# Patient Record
Sex: Male | Born: 1940 | Race: White | Hispanic: No | Marital: Married | Smoking: Former smoker
Health system: Southern US, Community
[De-identification: ages and names within clinical notes are randomized; demographics above are authoritative.]

## PROBLEM LIST (undated history)

## (undated) DIAGNOSIS — I429 Cardiomyopathy, unspecified: Secondary | ICD-10-CM

## (undated) DIAGNOSIS — N183 Chronic kidney disease, stage 3 unspecified: Secondary | ICD-10-CM

## (undated) DIAGNOSIS — E669 Obesity, unspecified: Secondary | ICD-10-CM

## (undated) DIAGNOSIS — E785 Hyperlipidemia, unspecified: Secondary | ICD-10-CM

## (undated) DIAGNOSIS — C44309 Unspecified malignant neoplasm of skin of other parts of face: Secondary | ICD-10-CM

## (undated) DIAGNOSIS — M1990 Unspecified osteoarthritis, unspecified site: Secondary | ICD-10-CM

## (undated) DIAGNOSIS — I10 Essential (primary) hypertension: Secondary | ICD-10-CM

## (undated) DIAGNOSIS — I2510 Atherosclerotic heart disease of native coronary artery without angina pectoris: Secondary | ICD-10-CM

## (undated) DIAGNOSIS — I219 Acute myocardial infarction, unspecified: Secondary | ICD-10-CM

## (undated) DIAGNOSIS — K219 Gastro-esophageal reflux disease without esophagitis: Secondary | ICD-10-CM

## (undated) HISTORY — DX: Hyperlipidemia, unspecified: E78.5

## (undated) HISTORY — DX: Chronic kidney disease, stage 3 (moderate): N18.3

## (undated) HISTORY — PX: CORONARY ANGIOPLASTY WITH STENT PLACEMENT: SHX49

## (undated) HISTORY — DX: Cardiomyopathy, unspecified: I42.9

## (undated) HISTORY — DX: Atherosclerotic heart disease of native coronary artery without angina pectoris: I25.10

## (undated) HISTORY — PX: INGUINAL HERNIA REPAIR: SUR1180

## (undated) HISTORY — DX: Unspecified osteoarthritis, unspecified site: M19.90

## (undated) HISTORY — PX: TONSILLECTOMY: SUR1361

## (undated) HISTORY — DX: Obesity, unspecified: E66.9

## (undated) HISTORY — DX: Chronic kidney disease, stage 3 unspecified: N18.30

## (undated) HISTORY — PX: SKIN CANCER EXCISION: SHX779

## (undated) HISTORY — PX: CARDIAC CATHETERIZATION: SHX172

---

## 2000-09-09 ENCOUNTER — Encounter: Admission: RE | Admit: 2000-09-09 | Discharge: 2000-09-09 | Payer: Self-pay | Admitting: Family Medicine

## 2000-09-09 ENCOUNTER — Encounter: Payer: Self-pay | Admitting: Family Medicine

## 2005-12-07 ENCOUNTER — Ambulatory Visit: Payer: Self-pay | Admitting: Internal Medicine

## 2005-12-21 ENCOUNTER — Ambulatory Visit: Payer: Self-pay | Admitting: Internal Medicine

## 2007-11-22 DIAGNOSIS — I219 Acute myocardial infarction, unspecified: Secondary | ICD-10-CM

## 2007-11-22 HISTORY — DX: Acute myocardial infarction, unspecified: I21.9

## 2008-01-08 ENCOUNTER — Inpatient Hospital Stay (HOSPITAL_BASED_OUTPATIENT_CLINIC_OR_DEPARTMENT_OTHER): Admission: RE | Admit: 2008-01-08 | Discharge: 2008-01-08 | Payer: Self-pay | Admitting: Cardiology

## 2008-01-14 ENCOUNTER — Inpatient Hospital Stay (HOSPITAL_COMMUNITY): Admission: RE | Admit: 2008-01-14 | Discharge: 2008-01-15 | Payer: Self-pay | Admitting: Interventional Cardiology

## 2008-01-16 ENCOUNTER — Ambulatory Visit: Payer: Self-pay | Admitting: Cardiology

## 2008-01-31 ENCOUNTER — Encounter (HOSPITAL_COMMUNITY): Admission: RE | Admit: 2008-01-31 | Discharge: 2008-04-30 | Payer: Self-pay | Admitting: Cardiology

## 2008-06-12 ENCOUNTER — Telehealth: Payer: Self-pay | Admitting: Internal Medicine

## 2008-07-15 DIAGNOSIS — Z860100 Personal history of colon polyps, unspecified: Secondary | ICD-10-CM | POA: Insufficient documentation

## 2008-07-15 DIAGNOSIS — R1084 Generalized abdominal pain: Secondary | ICD-10-CM | POA: Insufficient documentation

## 2009-02-27 ENCOUNTER — Ambulatory Visit (HOSPITAL_COMMUNITY): Admission: RE | Admit: 2009-02-27 | Discharge: 2009-02-27 | Payer: Self-pay | Admitting: General Surgery

## 2011-03-02 LAB — DIFFERENTIAL
Basophils Absolute: 0 10*3/uL (ref 0.0–0.1)
Basophils Relative: 0 % (ref 0–1)
Eosinophils Absolute: 0.4 10*3/uL (ref 0.0–0.7)
Eosinophils Relative: 6 % — ABNORMAL HIGH (ref 0–5)
Lymphocytes Relative: 28 % (ref 12–46)
Lymphs Abs: 2 10*3/uL (ref 0.7–4.0)
Monocytes Absolute: 1 10*3/uL (ref 0.1–1.0)
Monocytes Relative: 14 % — ABNORMAL HIGH (ref 3–12)
Neutro Abs: 3.5 10*3/uL (ref 1.7–7.7)
Neutrophils Relative %: 51 % (ref 43–77)

## 2011-03-02 LAB — BASIC METABOLIC PANEL
BUN: 16 mg/dL (ref 6–23)
CO2: 26 mEq/L (ref 19–32)
Calcium: 9.7 mg/dL (ref 8.4–10.5)
Chloride: 106 mEq/L (ref 96–112)
Creatinine, Ser: 0.88 mg/dL (ref 0.4–1.5)
GFR calc Af Amer: >60 mL/min (ref >60)
GFR calc non Af Amer: >60 mL/min (ref >60)
Glucose, Bld: 118 mg/dL — ABNORMAL HIGH (ref 70–99)
Potassium: 4.3 mEq/L (ref 3.5–5.1)
Sodium: 139 mEq/L (ref 135–145)

## 2011-03-02 LAB — CBC
HCT: 41.8 % (ref 39.0–52.0)
Hemoglobin: 14.1 g/dL (ref 13.0–17.0)
MCHC: 33.8 g/dL (ref 30.0–36.0)
MCV: 88.2 fL (ref 78.0–100.0)
Platelets: 247 10*3/uL (ref 150–400)
RBC: 4.74 MIL/uL (ref 4.22–5.81)
RDW: 14.1 % (ref 11.5–15.5)
WBC: 6.9 10*3/uL (ref 4.0–10.5)

## 2011-04-05 NOTE — Op Note (Signed)
NAME:  Thomas Farrell, Thomas Farrell NO.:  [id number]   MEDICAL RECORD NO.:  [id number]          PATIENT TYPE:  AMB   LOCATION:  SDS                          FACILITY:  [facility]   PHYSICIAN:  Lennie Muckle, MD      DATE OF BIRTH:  Aug 12, 1941   DATE OF PROCEDURE:  02/27/2009  DATE OF DISCHARGE:  02/27/2009                               OPERATIVE REPORT   PREOPERATIVE DIAGNOSES:  Right inguinal hernia, reducible.   POSTOPERATIVE DIAGNOSES:  Right inguinal hernia, reducible.   PROCEDURE:  Laparoscopic right inguinal hernia repair.   SURGEON:  Lennie Muckle, MD   ASSISTANT:  None.   ANESTHESIA:  General endotracheal anesthesia.   FINDINGS:  Direct hernia defect.  No immediate complications.  No drains  were placed.   INDICATIONS FOR PROCEDURE:  Thomas Farrell is a pleasant 70 year old male  who had had hernia for greater than 10 years.  He began having pain  approximately a year and a half ago.  He had seen me for repair of his  hernia.  We talked about open and laparoscopic repair.  Informed consent  was obtained prior to the procedure.  He was cleared by Cardiology.   DETAILS OF PROCEDURE:  Thomas Farrell was identified in the preoperative  holding area, received 2 g of Kefzol and was taken to the operating  room.  Once in the operating room, placed in a supine position.  He had  voided prior to going to the operating room, so after administration of  general endotracheal anesthesia, his abdomen was prepped and draped in  the usual steroid fashion.  A timeout procedure indicating  the patient  and procedure was performed.  I placed an incision just beneath the  umbilicus and identified the anterior rectus fascia.  Incised this with  #11 blade, finger dissected in the preperitoneal space.  I then placed a  balloon Spacemaker Plus into the preperitoneal space and insufflated.  Monitor this under the visualization of the camera.  I waited a minute.  Then I retracted it  perineally and redeployed the balloon.  I waited  approximately another minute, removed the Spacemaker Plus balloon and  insufflated the small balloon at the port.  I then insufflated the  preperitoneal space.  Two 5 mm trocars were placed in the midline under  the visualization with the camera.  I began dissecting along the right  lateral sidewall.  I continued dissecting medially down to the internal  ring.  There appeared to be a large amount of peritoneum medially.  I  dissected and found the internal ring.  I then proceeded medially and  found the direct hernia defect, which I was able to successfully reduce.  I did make a tear in the peritoneum and closed this with a PDS loop.  Two PDS Endoloops were used for closure of the peritoneum.  I continued  dissecting, but began losing some of the preperitoneal space with some  of the CO2 going into the abdomen.  I then placed the Angiocath in  abdomen to evacuate the CO2 to make easy placement of the  mesh.  A 3/6  piece of UltraPro mesh was placed in the preperitoneal space.  I tacked  this around the direct hernia defect at Cooper's ligament and laterally  while palpating ProTack device.  Held the inferior edge of the mesh to  allow the peritoneum to close up over the mesh.  I then released the  pneumoperitoneum, removed the trocars, closed the fascial defect at the  umbilicus with 0 Vicryl suture.  Skin was closed with 4-0 Monocryl and Dermabond with final dressing.  The patient was extubated and transported to postanesthesia care unit in  stable condition.  Was given oxycodone for pain, restricted activities  not lifting greater than 20 pounds for approximately 3 weeks, will see  me in 3 weeks.      Lennie Muckle, MD  Electronically Signed     ALA/MEDQ  D:  02/27/2009  T:  02/28/2009  Job:  [id number]   cc:   Tamera Reason, MD

## 2011-04-05 NOTE — Cardiovascular Report (Signed)
NAME:  Thomas Farrell, Thomas Farrell NO.:  [id number]   MEDICAL RECORD NO.:  [id number]          PATIENT TYPE:  INP   LOCATION:  6525                         FACILITY:  [facility]   PHYSICIAN:  Corky Crafts, MDDATE OF BIRTH:  [birth year]   DATE OF PROCEDURE:  01/14/2008  DATE OF DISCHARGE:                            CARDIAC CATHETERIZATION   REFERRING PHYSICIAN:  Mark C. Anne Fu, MD   PRIMARY CARE PHYSICIAN:  C. Duane Lope, M.D.   INDICATIONS:  Stable angina.   PROCEDURE:  The risks and benefits of PCI were explained to the patient,  and informed consent was obtained. The patient was brought to the  catheterization lab. He was prepped and draped in the usual sterile  fashion.  His right groin was infiltrated with 1% lidocaine.  A 7-French  arterial sheath was placed into the right femoral artery using the  modified Seldinger technique.  The PCI was performed.  See below for  details.  This sheath was removed, and a StarClose was attempted to be  deployed but was unsuccessful.  The clip was actually never deployed in  the patient's body. Manual compression was held, and good hemostasis was  obtained.   FINDINGS:  Occluded mid RCA with right-to-right collaterals as well as  some left-to-right collaterals.   PCI NARRATIVE:  Initially a JR-4 guiding catheter was used to engage the  right coronary artery.  A BMW wire was placed and a 2.0 x 12 mm over-the-  wire Maverick balloon.  The wire was switched out for a 3-gram Miracle  Brothers wire. As this system was unable to cross the lesion, the guide  was changed out for an AL-1 guide. The Miracle Brothers 3-gram wire was  used and did get into the stenosis but could not completely cross. This  was switched out for a Miracle 6-gram wire into the over-the-wire  balloon.  The wire did cross; however, the 2.0 x 12 mm Maverick balloon  would not cross. The balloon was changed out for 1.5 x 6 mm Sprinter  balloon over the wire. It was  placed within the lesion and inflated to  12 atmospheres for 41 seconds, 12 atmospheres 30 seconds, and three  subsequent inflations were done to 12 atmospheres for 25, 26, and 20  seconds, respectively.  The balloon was then switched out for the 2.0 x  12 Maverick which did cross.  The first inflation was at 10 atmospheres  for 15 seconds, then at 12 atmospheres for 35 seconds, and then more  proximally to 16 atmospheres for 30 seconds.  A 2.5 x 18 mm PROMUS stent  was placed across the lesion and deployed at 12 atmospheres for 50  seconds.  After this stent was placed, it was evident that the distal  vessel was very diffusely diseased.  There was a focal 70% lesion past  the edge of the stent in the mid RCA. This did not go away when the wire  was pulled back.  We had switched out the Miracle Brothers 6-gram wire  for an over-the-wire Mailman balloon hoping to avoid distal vessel  trauma. The  lesion was not related to wire bias; therefore, a 2.5 x 12  mm PROMUS stent was placed in an overlapping fashion and used to cover  the more distal lesion in the mid RCA. A BMW wire was then used to  rewire the right coronary artery.  A 2.75 x 12 mm Quantum balloon was  used to post dilate the stented areas.  It was inflated to 16  atmospheres for 39 seconds, then to 18 atmospheres for 40 seconds more  proximally, the to 14 atmospheres for 20 seconds at the overlap.  Dye  staining was noted in the distal RCA likely secondary to wire trauma  after the BMW was used to wire the distal right. There was TIMI III  flow.  The patient was not having any chest pain or shortness of breath.  The result in the mid RCA was very good.   IMPRESSION:  1. Successful percutaneous coronary intervention  of the right      coronary artery with 2.5 x 18 and 2.5 x 12 mm PROMUS stent in      overlapping fashion and postdilated to 2.75-2.8 mm in diameter.  2. Likely wire-induced dissection in a diffusely diseased distal  right      coronary artery with normal flow reaching the distal vessel.  There      is still collateral flow as well present in the distal right which      was competitive in the posterior descending artery.   RECOMMENDATIONS:  The patient should continue aspirin and Plavix  indefinitely.  He will also stay on Integrilin for at least 18 hours.  The distal vessel is heavily diseased, and, therefore, we did not  attempt to try to stent the distal wire dissection. The patient will be  watched overnight.      Corky Crafts, MD  Electronically Signed     JSV/MEDQ  D:  01/14/2008  T:  01/14/2008  Job:  [id number]   cc:   Jake Bathe, MD  C. Duane Lope, M.D.

## 2011-04-05 NOTE — Cardiovascular Report (Signed)
NAME:  CELESTE, CANDELAS NO.:  [id number]   MEDICAL RECORD NO.:  [id number]          PATIENT TYPE:  OIB   LOCATION:  1962                         FACILITY:  [facility]   PHYSICIAN:  Jake Bathe, MD      DATE OF BIRTH:  08-04-1941   DATE OF PROCEDURE:  01/08/2008  DATE OF DISCHARGE:  01/08/2008                            CARDIAC CATHETERIZATION   PROCEDURE:  1. Left heart catheterization.  2. Left ventriculogram.  3. Selective coronary angiography.   INDICATIONS:  A 70 year old male with hypertension; hyperlipidemia;  former tobacco use, quit in 2003; and family history of coronary artery  disease with father having myocardial infarction at age 16, brother  having MI at age 39 who had an abnormal nuclear stress test showing  inferior, posterior/lateral infarction with some evidence of peri-  infarct ischemia, ejection fraction 45% to 50%.  He has been  experiencing chest tightness as well as shortness of breath.  He enjoys  playing golf.   PROCEDURE DETAILS:  Informed consent was obtained.  Risks and benefits  including stroke, heart attack, and death were explained to the patient.  He was placed on the catheterization table.  Prepped in sterile fashion.  Lidocaine 1% was used to infiltrate the right groin after fluoroscopic  visualization of the right femoral head.  Using the modified Seldinger  technique, a 4-French catheter was placed in the right femoral artery.  A Judkins left #4 catheter was used to selectively cannulate the left  anterior descending artery.  Multiple views with hand injection of  Omnipaque were obtained.  A Judkins left #5 catheter was then used to  selectively cannulate the circumflex artery and multiple views were  obtained.  The right coronary artery was selected with the No-Torque  Williams right and multiple views with hand injection were obtained.  An  angled pigtail was used to cross the aortic valve into the left  ventricle.   Hemodynamics were obtained.  A left ventriculogram in the  RAO position was obtained with 25 mL of dye.  A pullback was obtained  and hemodynamics recorded.  Following the procedure, manual compression  was held to the right groin.  Sheath was removed.  Findings were  discussed with the patient's family and Dr. Eldridge Dace.   FINDINGS:  1. Left main - Short, gives rise to left anterior descending and      circumflex.  No significant disease.  2. Left anterior descending artery - There are 2 small caliber      diagonals.  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  Continues to the apex.  3. Left circumflex artery - This artery is diffusely diseased      proximally with ectatic/aneurysmal segments and it is 100% occluded      in the mid segment with some left-to-left and right-to-left      collaterals.  No clear channel is identified with continuation of      the obtuse marginal system.  4. Right coronary artery - This is a dominant vessel.  Gives rise to  posterior descending artery.  The right coronary artery is 100%      occluded in mid segment close to a bifurcation of a large acute      marginal branch.  There does appear to be forward flow through a      narrow channel in this portion.  This artery does encompass a large      territory.  There is right-to-right collateral filling as well as      left-to-right collateral filling.  5. Left ventriculogram - Inferior wall is hypokinetic from mid to base      with overall ejection fraction estimated at 45% to 50%.  No      significant MR.  6. Hemodynamics:  Left ventricle 114 systolic with left ventricular      end-diastolic pressure of 14 mmHg.  Aortic pressure is 114/63 with      a mean of 85 mmHg.  There was no significant gradient across the      aortic valve.   IMPRESSION:  1. Coronary artery disease as described above, most notably occluded      right coronary artery, mid,  as well as  occluded circumflex artery,      mid.  Left anterior descending artery has minor irregularities and      is relatively spared of significant disease.  2. Ejection fraction estimated at 45% to 50% with inferior wall      hypokinesis.   RECOMMENDATIONS:  After discussion with Dr. Eldridge Dace, we will place Mr.  Hyslop on Plavix and attempt to perform percutaneous intervention on  the right coronary artery given its small channel.  We will schedule  this next week.  We will discuss findings with the patient and his  family.      Jake Bathe, MD  Electronically Signed     MCS/MEDQ  D:  01/08/2008  T:  01/09/2008  Job:  [id number]   cc:   Miguel Aschoff, M.D.

## 2011-08-12 LAB — BASIC METABOLIC PANEL
BUN: 11
CO2: 29
Chloride: 102
Creatinine, Ser: 0.92
Glucose, Bld: 92
Potassium: 4.2

## 2011-08-12 LAB — CBC
HCT: 43
MCHC: 33.5
MCV: 85.7
Platelets: 275
RDW: 13.7
WBC: 9.6

## 2012-01-13 ENCOUNTER — Other Ambulatory Visit: Payer: Self-pay | Admitting: Cardiology

## 2012-01-16 ENCOUNTER — Inpatient Hospital Stay (HOSPITAL_BASED_OUTPATIENT_CLINIC_OR_DEPARTMENT_OTHER)
Admission: RE | Admit: 2012-01-16 | Discharge: 2012-01-16 | Disposition: A | Discharge status: Home / Self Care | Payer: Medicare Other | Source: Ambulatory Visit | Attending: Cardiology | Admitting: Cardiology

## 2012-01-16 ENCOUNTER — Encounter (HOSPITAL_BASED_OUTPATIENT_CLINIC_OR_DEPARTMENT_OTHER)
Admission: RE | Disposition: A | Discharge status: Home / Self Care | Payer: Self-pay | Source: Ambulatory Visit | Attending: Cardiology

## 2012-01-16 ENCOUNTER — Encounter (HOSPITAL_BASED_OUTPATIENT_CLINIC_OR_DEPARTMENT_OTHER): Payer: Self-pay | Admitting: Cardiology

## 2012-01-16 DIAGNOSIS — I10 Essential (primary) hypertension: Secondary | ICD-10-CM | POA: Insufficient documentation

## 2012-01-16 DIAGNOSIS — R0609 Other forms of dyspnea: Secondary | ICD-10-CM | POA: Insufficient documentation

## 2012-01-16 DIAGNOSIS — Z9861 Coronary angioplasty status: Secondary | ICD-10-CM | POA: Insufficient documentation

## 2012-01-16 DIAGNOSIS — I2510 Atherosclerotic heart disease of native coronary artery without angina pectoris: Secondary | ICD-10-CM | POA: Insufficient documentation

## 2012-01-16 DIAGNOSIS — R0989 Other specified symptoms and signs involving the circulatory and respiratory systems: Secondary | ICD-10-CM | POA: Insufficient documentation

## 2012-01-16 DIAGNOSIS — E785 Hyperlipidemia, unspecified: Secondary | ICD-10-CM | POA: Insufficient documentation

## 2012-01-16 SURGERY — JV LEFT HEART CATHETERIZATION WITH CORONARY ANGIOGRAM
Anesthesia: Moderate Sedation

## 2012-01-16 MED ORDER — DIAZEPAM 5 MG PO TABS: 5.0000 mg | ORAL_TABLET | ORAL | Status: DC

## 2012-01-16 MED ORDER — ASPIRIN 81 MG PO CHEW: 324.0000 mg | CHEWABLE_TABLET | ORAL | Status: DC

## 2012-01-16 MED ORDER — ACETAMINOPHEN 325 MG PO TABS: 650.0000 mg | ORAL_TABLET | ORAL | Status: DC | PRN

## 2012-01-16 MED ORDER — SODIUM CHLORIDE 0.9 % IJ SOLN: 3.0000 mL | Freq: Two times a day (BID) | INTRAMUSCULAR | Status: DC

## 2012-01-16 MED ORDER — SODIUM CHLORIDE 0.9 % IV SOLN: INTRAVENOUS | Status: DC

## 2012-01-16 MED ORDER — SODIUM CHLORIDE 0.9 % IV SOLN: 250.0000 mL | INTRAVENOUS | Status: DC | PRN

## 2012-01-16 MED ORDER — SODIUM CHLORIDE 0.9 % IJ SOLN: 3.0000 mL | INTRAMUSCULAR | Status: DC | PRN

## 2012-01-16 MED ORDER — ONDANSETRON HCL 4 MG/2ML IJ SOLN: 4.0000 mg | Freq: Four times a day (QID) | INTRAMUSCULAR | Status: DC | PRN

## 2012-01-16 MED ORDER — SODIUM CHLORIDE 0.9 % IV SOLN: 1.0000 mL/kg/h | INTRAVENOUS | Status: DC

## 2012-01-16 NOTE — OR Nursing (Signed)
Discharge instructions reviewed and signed, pt stated understanding, ambulated in hall without difficulty, site level 0, transported to friend's car via wheelchair 

## 2012-01-16 NOTE — OR Nursing (Signed)
Dr Skains at bedside to discuss results and treatment plan with pt and family 

## 2012-01-16 NOTE — CV Procedure (Signed)
PROCEDURE:  Left heart catheterization with selective coronary angiography, left ventriculogram.  INDICATIONS:  71 year old with coronary artery disease post Promus DES to the RCA, residual occluded OM with collaterals, mildly decreased/low normal ejection fraction here for cardiac catheterization because of increased dyspnea on exertion. On the Thursday prior to playing golf on Friday he had increasing dyspnea on exertion. For instance when going up from his basement, 13 stairs, he was feeling significant shortness of breath. This is not usual for him. Interestingly, he states that the symptoms have resolved. He only has mild shortness of breath currently. History prominent at the time of office visit on 01/09/12 was normal. BNP was normal at 18. His EKG did show hyperdynamic/peaked T waves anteriorly.  The risks, benefits, and details of the procedure were explained to the patient.  The patient verbalized understanding and wanted to proceed.  Informed written consent was obtained.  PROCEDURE TECHNIQUE:  After Xylocaine anesthesia and visualization of the femoral head via fluoroscopy, a 68F sheath was placed in the right femoral artery with a single anterior needle wall stick.   Left coronary angiography was done using a Judkins L4 catheter.  Right coronary angiography was done using a no torque Williams catheter.  Left ventriculography was done using a pigtail catheter.    CONTRAST:  Total of 100 ml.  FLOUROSCOPY TIME: 3.5 minutes.   COMPLICATIONS:  None.    HEMODYNAMICS:  Aortic pressure was 105/61/79 mmHg; LV systolic pressure was 103 mmHg; LVEDP 10 mmHg.  There was no gradient between the left ventricle and aorta.    ANGIOGRAPHIC DATA:    Left main: Technically dual ostia. Very short left main.  Left anterior descending (LAD): Mildly diffuse disease, proximal small aneurysmal segment. Large first diagonal branch. Second diagonal branch is small in caliber and has approximately a 60% stenosis  proximally. This branch would not be amenable to PCI. The proximal LAD has 30-40% disease. Calcified.  Circumflex artery (CIRC): 100% occluded after a large ectatic aneurysmal proximal segment. There are collaterals distally left to left noted. The distal segment is quite small in caliber. This is unchanged from prior.  Right coronary artery (RCA): Diffusely calcified. In the proximal section, there is a highly calcified lesion, approaching 70-80% focally just after a small atrial branch. This was seen on prior catheterization. This may be slightly worse than on prior catheterization. The previously placed stent appears to be widely patent. There is proximal in-stent stenosis of approximately 30-40%. After the stent, there is a focal lesion of approximately 90%, napkin ring that seems to have progressed from prior catheterization.  LEFT VENTRICULOGRAM:  Left ventricular angiogram was done in the 30 RAO projection and revealed basal inferior wall akinesis with an estimated ejection fraction of 55%.   IMPRESSIONS:  1. Previously placed DES to prox/mid RCA has approximately 30-40% distal in stent stenosis. The proximal section of the RCA is also diffusely calcified with a focal up to 70% lesion which seems to be worsened from prior cath. Distal to the stent, there is a focal 90%, napkin ring lesion as well. This appears to be worse from prior cath.  2. Occluded circumflex with left to left collaterals. No change. 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 No change.  3. Ejection fraction appears normal at 55% with basal inferior wall hypokinesis. There is no gradient across the aortic valve.  RECOMMENDATION:  I will discuss films with Dr.Varanasi. His symptoms seem to have improved however new lesions appear in the right coronary artery.  We will contemplate percutaneous intervention. I have discussed with patient.

## 2012-01-16 NOTE — H&P (Signed)
  Please see office note scanned in for details but briefly 71 year old male with coronary artery disease status post drug eluding stent to the RCA which was chronically occluded but opened with proximal aneurysmal segment of the LAD, left circumflex diffusely diseased ectatic, 100% occluded in the midsegment with left to left and right-to-left collaterals with no clear channel identified, right coronary artery 100% as described above. His EF previously was 40-45% in 2009 with increased ejection fraction of 50-55% with mild inferior wall hypokinesis. He has hypertension hyperlipidemia. He has been experiencing increased shortness of breath, dyspnea on exertion. His EKG was somewhat concerning for peaked T waves, possibly hyperacute. Troponin was normal. BNP was normal at 18. With his increased dyspnea on exertion and shortness of breath, cardiac catheterization is taking place. Risks and benefits of the procedure including stroke, heart attack, death have been explained to the patient at length. Physical exam has been performed and is unremarkable. Lab work has been reviewed. We will proceed.

## 2012-01-16 NOTE — OR Nursing (Signed)
Meal served 

## 2012-01-16 NOTE — OR Nursing (Signed)
Tegaderm dressing applied, site level 0, bedrest begins at 1030 

## 2012-01-20 ENCOUNTER — Encounter (HOSPITAL_COMMUNITY): Payer: Self-pay | Admitting: Pharmacy Technician

## 2012-01-23 ENCOUNTER — Other Ambulatory Visit: Payer: Self-pay | Admitting: Interventional Cardiology

## 2012-01-25 ENCOUNTER — Ambulatory Visit (HOSPITAL_COMMUNITY)
Admission: RE | Admit: 2012-01-25 | Discharge: 2012-01-26 | Disposition: A | Discharge status: Home / Self Care | Payer: Medicare Other | Source: Ambulatory Visit | Attending: Interventional Cardiology | Admitting: Interventional Cardiology

## 2012-01-25 ENCOUNTER — Other Ambulatory Visit: Payer: Self-pay

## 2012-01-25 ENCOUNTER — Encounter (HOSPITAL_COMMUNITY): Payer: Self-pay | Admitting: General Practice

## 2012-01-25 ENCOUNTER — Encounter (HOSPITAL_COMMUNITY)
Admission: RE | Disposition: A | Discharge status: Home / Self Care | Payer: Self-pay | Source: Ambulatory Visit | Attending: Interventional Cardiology

## 2012-01-25 DIAGNOSIS — R0602 Shortness of breath: Secondary | ICD-10-CM | POA: Insufficient documentation

## 2012-01-25 DIAGNOSIS — I2510 Atherosclerotic heart disease of native coronary artery without angina pectoris: Secondary | ICD-10-CM | POA: Insufficient documentation

## 2012-01-25 HISTORY — DX: Gastro-esophageal reflux disease without esophagitis: K21.9

## 2012-01-25 HISTORY — DX: Essential (primary) hypertension: I10

## 2012-01-25 HISTORY — PX: PERCUTANEOUS CORONARY STENT INTERVENTION (PCI-S): SHX5485

## 2012-01-25 HISTORY — DX: Acute myocardial infarction, unspecified: I21.9

## 2012-01-25 LAB — CBC
Hemoglobin: 14.1 g/dL (ref 13.0–17.0)
MCH: 28.9 pg (ref 26.0–34.0)
MCHC: 34.5 g/dL (ref 30.0–36.0)
MCV: 83.8 fL (ref 78.0–100.0)
RBC: 4.88 MIL/uL (ref 4.22–5.81)

## 2012-01-25 LAB — BASIC METABOLIC PANEL
CO2: 23 mEq/L (ref 19–32)
Calcium: 9.2 mg/dL (ref 8.4–10.5)
Creatinine, Ser: 0.88 mg/dL (ref 0.50–1.35)
GFR calc non Af Amer: 84 mL/min — ABNORMAL LOW (ref >90)
Glucose, Bld: 106 mg/dL — ABNORMAL HIGH (ref 70–99)

## 2012-01-25 LAB — PLATELET COUNT: Platelets: 213 10*3/uL (ref 150–400)

## 2012-01-25 SURGERY — PERCUTANEOUS CORONARY STENT INTERVENTION (PCI-S)
Anesthesia: LOCAL

## 2012-01-25 MED ORDER — EPTIFIBATIDE 75 MG/100ML IV SOLN
2.0000 ug/kg/min | INTRAVENOUS | Status: DC
  Administered 2012-01-25: 2 ug/kg/min via INTRAVENOUS
  Filled 2012-01-25: qty 100

## 2012-01-25 MED ORDER — EZETIMIBE 10 MG PO TABS
10.0000 mg | ORAL_TABLET | Freq: Every day | ORAL | Status: DC
  Administered 2012-01-26: 10 mg via ORAL
  Filled 2012-01-25: qty 1

## 2012-01-25 MED ORDER — SODIUM CHLORIDE 0.9 % IJ SOLN: 3.0000 mL | Freq: Two times a day (BID) | INTRAMUSCULAR | Status: DC

## 2012-01-25 MED ORDER — SODIUM CHLORIDE 0.9 % IV SOLN
1.0000 mL/kg/h | INTRAVENOUS | Status: AC
  Administered 2012-01-25: 1 mL/kg/h via INTRAVENOUS

## 2012-01-25 MED ORDER — EPTIFIBATIDE 75 MG/100ML IV SOLN
2.0000 ug/kg/min | INTRAVENOUS | Status: AC
  Administered 2012-01-25 – 2012-01-26 (×3): 2 ug/kg/min via INTRAVENOUS
  Filled 2012-01-25 (×3): qty 100

## 2012-01-25 MED ORDER — ASPIRIN 81 MG PO CHEW
CHEWABLE_TABLET | ORAL | Status: AC
  Administered 2012-01-25: 324 mg via ORAL
  Filled 2012-01-25: qty 4

## 2012-01-25 MED ORDER — SODIUM CHLORIDE 0.9 % IJ SOLN
3.0000 mL | Freq: Two times a day (BID) | INTRAMUSCULAR | Status: DC
  Administered 2012-01-25 – 2012-01-26 (×2): 3 mL via INTRAVENOUS

## 2012-01-25 MED ORDER — LIDOCAINE HCL (PF) 1 % IJ SOLN
INTRAMUSCULAR | Status: AC
  Filled 2012-01-25: qty 30

## 2012-01-25 MED ORDER — ONDANSETRON HCL 4 MG/2ML IJ SOLN: 4.0000 mg | Freq: Four times a day (QID) | INTRAMUSCULAR | Status: DC | PRN

## 2012-01-25 MED ORDER — METOPROLOL TARTRATE 50 MG PO TABS
50.0000 mg | ORAL_TABLET | Freq: Two times a day (BID) | ORAL | Status: DC
  Administered 2012-01-25 – 2012-01-26 (×2): 50 mg via ORAL
  Filled 2012-01-25 (×3): qty 1

## 2012-01-25 MED ORDER — NITROGLYCERIN 0.2 MG/ML ON CALL CATH LAB
INTRAVENOUS | Status: AC
  Filled 2012-01-25: qty 1

## 2012-01-25 MED ORDER — CLOPIDOGREL BISULFATE 75 MG PO TABS
75.0000 mg | ORAL_TABLET | Freq: Every day | ORAL | Status: DC
  Administered 2012-01-26: 75 mg via ORAL
  Filled 2012-01-25: qty 1

## 2012-01-25 MED ORDER — SODIUM CHLORIDE 0.9 % IJ SOLN: 3.0000 mL | INTRAMUSCULAR | Status: DC | PRN

## 2012-01-25 MED ORDER — ATORVASTATIN CALCIUM 80 MG PO TABS
80.0000 mg | ORAL_TABLET | Freq: Every day | ORAL | Status: DC
  Administered 2012-01-25: 80 mg via ORAL
  Filled 2012-01-25 (×2): qty 1

## 2012-01-25 MED ORDER — VERAPAMIL HCL 2.5 MG/ML IV SOLN
INTRAVENOUS | Status: AC
  Filled 2012-01-25: qty 2

## 2012-01-25 MED ORDER — EPTIFIBATIDE 75 MG/100ML IV SOLN
INTRAVENOUS | Status: AC
  Filled 2012-01-25: qty 100

## 2012-01-25 MED ORDER — COLESEVELAM HCL 625 MG PO TABS
1875.0000 mg | ORAL_TABLET | Freq: Two times a day (BID) | ORAL | Status: DC
  Administered 2012-01-25 – 2012-01-26 (×2): 1875 mg via ORAL
  Filled 2012-01-25 (×4): qty 3

## 2012-01-25 MED ORDER — DIAZEPAM 5 MG PO TABS
5.0000 mg | ORAL_TABLET | ORAL | Status: AC
  Administered 2012-01-25: 5 mg via ORAL

## 2012-01-25 MED ORDER — SODIUM CHLORIDE 0.9 % IV SOLN
INTRAVENOUS | Status: DC
  Administered 2012-01-25: 1000 mL via INTRAVENOUS

## 2012-01-25 MED ORDER — HEPARIN (PORCINE) IN NACL 2-0.9 UNIT/ML-% IJ SOLN
INTRAMUSCULAR | Status: AC
  Filled 2012-01-25: qty 1000

## 2012-01-25 MED ORDER — MIDAZOLAM HCL 2 MG/2ML IJ SOLN
INTRAMUSCULAR | Status: AC
  Filled 2012-01-25: qty 2

## 2012-01-25 MED ORDER — DIAZEPAM 5 MG PO TABS
ORAL_TABLET | ORAL | Status: AC
  Administered 2012-01-25: 5 mg via ORAL
  Filled 2012-01-25: qty 1

## 2012-01-25 MED ORDER — ASPIRIN 81 MG PO CHEW
324.0000 mg | CHEWABLE_TABLET | ORAL | Status: AC
  Administered 2012-01-25: 324 mg via ORAL

## 2012-01-25 MED ORDER — BIVALIRUDIN 250 MG IV SOLR
INTRAVENOUS | Status: AC
  Filled 2012-01-25: qty 250

## 2012-01-25 MED ORDER — FENTANYL CITRATE 0.05 MG/ML IJ SOLN
INTRAMUSCULAR | Status: AC
  Filled 2012-01-25: qty 2

## 2012-01-25 MED ORDER — ISOSORBIDE MONONITRATE ER 60 MG PO TB24
60.0000 mg | ORAL_TABLET | Freq: Every day | ORAL | Status: DC
  Administered 2012-01-26: 60 mg via ORAL
  Filled 2012-01-25: qty 1

## 2012-01-25 MED ORDER — SODIUM CHLORIDE 0.9 % IV SOLN: 250.0000 mL | INTRAVENOUS | Status: DC | PRN

## 2012-01-25 MED ORDER — ASPIRIN EC 325 MG PO TBEC
325.0000 mg | DELAYED_RELEASE_TABLET | Freq: Every day | ORAL | Status: DC
  Administered 2012-01-26: 325 mg via ORAL
  Filled 2012-01-25: qty 1

## 2012-01-25 MED ORDER — PANTOPRAZOLE SODIUM 40 MG PO TBEC
40.0000 mg | DELAYED_RELEASE_TABLET | Freq: Every day | ORAL | Status: DC
  Administered 2012-01-26: 40 mg via ORAL
  Filled 2012-01-25: qty 1

## 2012-01-25 NOTE — CV Procedure (Signed)
PROCEDURE:  PCI RCA; Drug eluting stent to the distal right coronary artery; Cutting Balloon angioplasty to the mid right coronary artery in-stent restenosis.  Intravascular ultrasound of the mid and proximal right coronary artery.  INDICATIONS:  Coronary artery disease, shortness of breath  The risks, benefits, and details of the procedure were explained to the patient.  The patient verbalized understanding and wanted to proceed.  Informed written consent was obtained.  PROCEDURE TECHNIQUE:  After Xylocaine anesthesia a 6 F sheath was placed in the right radial artery with a single anterior needle wall stick.  Right coronary angiography was done using an Ikari 1.0 guide catheter.  Angiomax was used for anticoagulation.  An ACT was used to confirm that it is therapeutic.  Diagnostic catheterization had been done previously by Dr. Anne Fu revealing a focal 90% lesion followed by some moderate disease in the distal right coronary artery.  There is also moderate in-stent restenosis in the mid right coronary artery of the previously placed stents.  He Pro water wire was placed into the distal right coronary artery territory across the areas of disease.  A 2.5 x 15 balloon was used to predilate the distal right coronary artery.  A 3.0 x 20 Promus element stent was then used to treat the distal right coronary artery.  This was postdilated with a 3.25 x 15 noncompliant balloon inflated to 18 atmospheres.  There is an excellent angiographic result with no residual stenosis in the distal right coronary artery.  TIMI-3 flow was maintained throughout.  The lesion length was 16 mm.  The intravascular ultrasound was then performed to assess the in-stent restenosis and moderate proximal right coronary artery disease.  This revealed an area of in-stent restenosis in the midportion of the previously stented segment with a cross-sectional area of 3.2 mm.  It did appear that the stent was undersized.  As the catheter  pullback, there was moderate disease in the more proximal portion of the mid right  coronary artery.  The smallest cross-sectional area seen was 4.4 mm, just past the origin of a large RV marginal branch.  There was circumferential calcification noted.  However, it was thought that this area was not hemodynamically significant.  There is moderate atherosclerosis in the proximal right coronary artery noted diabetes. Given the IVUS findings, a 3.0 x 10mm flextome Cutting Balloon was advanced to the mid right coronary artery.  Several balloon inflations were performed up to 12 atmospheres.  There was an improved angiographic result, but based on the intravascular ultrasound findings, it appeared the stented area could take a larger postdilatation.  A 3.25 x 10 cutting balloon was then advanced and deployed within the in-stent restenosis.  There is no significant residual stenosis.  Lesion length was 15 mm.  There was some haziness proximal to the previously placed stent which was unchanged from the original angiogram.  This likely represented intravascular calcium.  It was difficult to advance a cutting balloon down into the stented area.  There was some resistance with the more proximal calcification.  The patient did have mild chest discomfort with balloon inflations in the mid right coronary artery.  Several doses of intracoronary nitroglycerin were given to treat vasospasm in the distal right coronary artery territory.  CONTRAST:  Total of 95 cc.  COMPLICATIONS:  None.     IMPRESSIONS:  1.  Successful drug-eluting stent placement to the distal right coronary artery with a 3.0 x 20 mm Promus element stent, postdilated to 3.4 mm in diameter.  2.   Successful cutting balloon angioplasty of in-stent restenosis in the mid right coronary artery after intravascular ultrasound showed significant narrowing.  Initial 70% stenosis reduced to 0.  3.   Moderate stenosis with cross-sectional area by intravascular  ultrasound of greater than 4 mm, more proximal to the mid vessel stents.  This area has 360 circumferential calcium and would require rotational atherectomy to treat, if needed in the future.    RECOMMENDATION: Watch the patient overnight.  We'll start the patient on IV Integrilin since we did balloon inflations in the mid right coronary artery.  Continue aspirin Plavix for at least a year without interruption.  If he does need repeat intervention to that mid right coronary artery, would have to use rotational atherectomy to treat the more proximal areas in an attempt to be able to deliver equipment and deployed stents well.

## 2012-01-25 NOTE — Plan of Care (Signed)
Problem: Phase II Progression Outcomes Goal: Pain controlled Outcome: Completed/Met Date Met:  01/25/12 painfree Goal: Vascular site scale level 0 - I Vascular Site Scale Level 0: No bruising/bleeding/hematoma Level I (Mild): Bruising/Ecchymosis, minimal bleeding/ooozing, palpable hematoma < 3 cm Level II (Moderate): Bleeding not affecting hemodynamic parameters, pseudoaneurysm, palpable hematoma > 3 cm Outcome: Completed/Met Date Met:  01/25/12 Level 0

## 2012-01-25 NOTE — Progress Notes (Addendum)
ANTICOAGULATION CONSULT NOTE - Initial Consult  Pharmacy Consult for Integrilin Indication: chest pain/ACS  Assessment: 71 y/o male patient s/p PCI requiring continued anticoagulation d/t DES placement. Continue Integrilin post cath.   Plan:  Integrilin 43mcg/kg/min infusion and check cbc at 1800 tonight as to monitor for thrombocytopenia. Total infusion time 18 hours as ordered.    Allergies  Allergen Reactions  . Acetaminophen Diarrhea and Nausea And Vomiting  . Niacin Other (See Comments)    flushing    Patient Measurements: Height: 5\' 8"  (172.7 cm) Weight: 198 lb (89.812 kg) IBW/kg (Calculated) : 68.4   Vital Signs: Temp: 97.1 F (36.2 C) (03/06 0815) BP: 155/88 mmHg (03/06 0815) Pulse Rate: 58  (03/06 1035)  Labs:  [facility] 01/25/12 0828  HGB 14.1  HCT 40.9  PLT 230  APTT --  LABPROT 13.4  INR 1.00  HEPARINUNFRC --  CREATININE 0.88  CKTOTAL --  CKMB --  TROPONINI --   Estimated Creatinine Clearance: 83.9 ml/min (by C-G formula based on Cr of 0.88).  Medical History: No past medical history on file.  Medications:  Prescriptions prior to admission  Medication Sig Dispense Refill  . aspirin 81 MG tablet Take 81 mg by mouth daily.      . clopidogrel (PLAVIX) 75 MG tablet Take 75 mg by mouth daily.      [name] co-enzyme Q-10 30 MG capsule Take 200 mg by mouth daily.      . colesevelam (WELCHOL) 625 MG tablet Take 1,875 mg by mouth 2 (two) times daily with a meal.      . ezetimibe (ZETIA) 10 MG tablet Take 10 mg by mouth daily.      [name] GLUCOSAMINE PO Take 1,500 mg by mouth daily.      . isosorbide mononitrate (IMDUR) 60 MG 24 hr tablet Take 60 mg by mouth daily.      . metoprolol (LOPRESSOR) 50 MG tablet Take 50 mg by mouth 2 (two) times daily.      . pantoprazole (PROTONIX) 40 MG tablet Take 40 mg by mouth daily.      [name] POTASSIUM GLUCONATE PO Take 595 mg by mouth daily.      . rosuvastatin (CRESTOR) 40 MG tablet Take 40 mg by mouth daily.       Scheduled:      . aspirin  324 mg Oral Pre-Cath  . aspirin EC  325 mg Oral Daily  . atorvastatin  80 mg Oral q1800  . bivalirudin      . clopidogrel  75 mg Oral Daily  . colesevelam  1,875 mg Oral BID WC  . diazepam  5 mg Oral On Call  . eptifibatide      . ezetimibe  10 mg Oral Daily  . fentaNYL      . heparin      . isosorbide mononitrate  60 mg Oral Daily  . lidocaine      . metoprolol  50 mg Oral BID  . midazolam      . nitroGLYCERIN      . pantoprazole  40 mg Oral Daily  . verapamil      . DISCONTD: eptifibatide  2 mcg/kg/min Intravenous To Cath  . DISCONTD: sodium chloride  3 mL Intravenous Q12H   Verlene Mayer, PharmD, BCPS Pager [phone] 01/25/2012,1:10 PM

## 2012-01-25 NOTE — H&P (Signed)
  Date of Initial H&P: 01/23/12  History reviewed, patient examined, no change in status, stable for surgery.

## 2012-01-26 ENCOUNTER — Other Ambulatory Visit: Payer: Self-pay

## 2012-01-26 LAB — BASIC METABOLIC PANEL
BUN: 14 mg/dL (ref 6–23)
CO2: 25 mEq/L (ref 19–32)
Calcium: 8.4 mg/dL (ref 8.4–10.5)
Chloride: 104 mEq/L (ref 96–112)
Creatinine, Ser: 0.88 mg/dL (ref 0.50–1.35)
GFR calc Af Amer: >90 mL/min (ref >90)
GFR calc non Af Amer: 84 mL/min — ABNORMAL LOW (ref >90)
Glucose, Bld: 101 mg/dL — ABNORMAL HIGH (ref 70–99)
Potassium: 3.9 mEq/L (ref 3.5–5.1)
Sodium: 138 mEq/L (ref 135–145)

## 2012-01-26 LAB — CBC
HCT: 38.7 % — ABNORMAL LOW (ref 39.0–52.0)
Hemoglobin: 13 g/dL (ref 13.0–17.0)
RBC: 4.57 MIL/uL (ref 4.22–5.81)
WBC: 8.9 10*3/uL (ref 4.0–10.5)

## 2012-01-26 MED ORDER — ASPIRIN 325 MG PO TBEC: 325.0000 mg | DELAYED_RELEASE_TABLET | Freq: Every day | ORAL | Status: AC

## 2012-01-26 MED FILL — Dextrose Inj 5%: INTRAVENOUS | Qty: 50 | Status: AC

## 2012-01-26 NOTE — Discharge Summary (Signed)
Patient ID: Thomas Farrell MRN: [id number] DOB/AGE: 72-Apr-1942 72 y.o.  Admit date: 01/25/2012 Discharge date: 01/26/2012  Primary Discharge Diagnosis CAD Secondary Discharge Diagnosis HTN  Significant Diagnostic Studies: angiography: Cardiac cath with DES to distal RCA; cutting balloon angioplasty to restenosis in mid RCA.  Consults: None  Hospital Course: 71 y/o who was admitted after PCI of RCA as above.  No chest pain with walking.  No bleeding issues from wrist.  Felt well.  BP on morning of d/c was high.  Gave metoprolol early.  Need to follow as an outpatient.  Labs stable.  Ready for discharge.  Hyperacute T waves of anterior leads better the day after intervention.  Increased voltage noted in the inferior leads.  Discharge Exam: Blood pressure 173/80, pulse 58, temperature 98.2 F (36.8 C), temperature source Oral, resp. rate 18, height 5\' 8"  (1.727 m), weight 91.4 kg (201 lb 8 oz), SpO2 98.00%.  RRR S1S2 CTA bilaterally 2+ right radial pulse, no hematoma No edema Labs:   Lab Results  Component Value Date   WBC 8.9 01/26/2012   HGB 13.0 01/26/2012   HCT 38.7* 01/26/2012   MCV 84.7 01/26/2012   PLT 216 01/26/2012    Lab 01/26/12 0410  NA 138  K 3.9  CL 104  CO2 25  BUN 14  CREATININE 0.88  CALCIUM 8.4  PROT --  BILITOT --  ALKPHOS --  ALT --  AST --  GLUCOSE 101*   No results found for this basename: CKTOTAL, CKMB, CKMBINDEX, TROPONINI    No results found for this basename: CHOL   No results found for this basename: HDL   No results found for this basename: LDLCALC   No results found for this basename: TRIG   No results found for this basename: CHOLHDL   No results found for this basename: LDLDIRECT      Radiology:none EKG: sinus bradycardia; ST elevation in an early repolarization pattern  FOLLOW UP PLANS AND APPOINTMENTS  Medication List  As of 01/26/2012  8:10 AM   STOP taking these medications         aspirin 81 MG tablet         TAKE these  medications         aspirin 325 MG EC tablet   Take 1 tablet (325 mg total) by mouth daily.      clopidogrel 75 MG tablet   Commonly known as: PLAVIX   Take 75 mg by mouth daily.      co-enzyme Q-10 30 MG capsule   Take 200 mg by mouth daily.      colesevelam 625 MG tablet   Commonly known as: WELCHOL   Take 1,875 mg by mouth 2 (two) times daily with a meal.      ezetimibe 10 MG tablet   Commonly known as: ZETIA   Take 10 mg by mouth daily.      GLUCOSAMINE PO   Take 1,500 mg by mouth daily.      isosorbide mononitrate 60 MG 24 hr tablet   Commonly known as: IMDUR   Take 60 mg by mouth daily.      metoprolol 50 MG tablet   Commonly known as: LOPRESSOR   Take 50 mg by mouth 2 (two) times daily.      pantoprazole 40 MG tablet   Commonly known as: PROTONIX   Take 40 mg by mouth daily.      POTASSIUM GLUCONATE PO   Take 595 mg by  mouth daily.      rosuvastatin 40 MG tablet   Commonly known as: CRESTOR   Take 40 mg by mouth daily.           Follow-up Information    Follow up with Donato Schultz, MD in 2 weeks.   Contact information:   [address] [place] [address] [phone]          BRING ALL MEDICATIONS WITH YOU TO FOLLOW UP APPOINTMENTS  Time spent with patient to include physician time:20 minutes Signed: Jesusa Stenerson S. 01/26/2012, 8:10 AM

## 2012-01-26 NOTE — Discharge Instructions (Addendum)
Avoid lifting > 10 lbs or bending right wrist for the next 3 days.  Follow post radial cath instructions given at discharge.

## 2012-01-26 NOTE — Progress Notes (Signed)
CARDIAC REHAB PHASE I   PRE:  Rate/Rhythm: 65SR  BP:  Supine:   Sitting: 163/76  Standing:    SaO2: 100%RA  MODE:  Ambulation: 680 ft   POST:  Rate/Rhythem: 87SR  BP:  Supine:   Sitting: 182/77  Standing:    SaO2: 100%RA 0820-0920 Pt walked 680 ft with steady fast pace. Denied chest pain or SOB. Tolerated well. Pt has attended Phase 2 before and has ex equipment at home.  He does not feel that he needs to repeat program as he understands what he is to do. BP elevated after walk at 182/77. Education completed.  Duanne Limerick

## 2012-11-28 ENCOUNTER — Encounter: Payer: Self-pay | Admitting: Internal Medicine

## 2013-09-04 ENCOUNTER — Telehealth: Payer: Self-pay

## 2013-09-05 MED ORDER — EZETIMIBE 10 MG PO TABS: 10.0000 mg | ORAL_TABLET | Freq: Every day | ORAL | Status: DC

## 2013-09-05 NOTE — Telephone Encounter (Signed)
Rx refill

## 2013-09-06 ENCOUNTER — Other Ambulatory Visit: Payer: Self-pay

## 2013-09-06 ENCOUNTER — Other Ambulatory Visit: Payer: Self-pay | Admitting: Cardiology

## 2013-09-06 MED ORDER — METOPROLOL TARTRATE 50 MG PO TABS: 50.0000 mg | ORAL_TABLET | Freq: Two times a day (BID) | ORAL | Status: DC

## 2013-09-06 MED ORDER — ISOSORBIDE MONONITRATE ER 60 MG PO TB24: 90.0000 mg | ORAL_TABLET | Freq: Every day | ORAL | Status: DC

## 2013-10-15 ENCOUNTER — Other Ambulatory Visit: Payer: Self-pay

## 2013-10-15 MED ORDER — CLOPIDOGREL BISULFATE 75 MG PO TABS: 75.0000 mg | ORAL_TABLET | Freq: Every day | ORAL | Status: DC

## 2013-10-15 MED ORDER — ROSUVASTATIN CALCIUM 40 MG PO TABS: 40.0000 mg | ORAL_TABLET | Freq: Every day | ORAL | Status: DC

## 2013-11-07 ENCOUNTER — Telehealth: Payer: Self-pay | Admitting: Cardiology

## 2013-11-07 DIAGNOSIS — E782 Mixed hyperlipidemia: Secondary | ICD-10-CM

## 2013-11-07 DIAGNOSIS — I2584 Coronary atherosclerosis due to calcified coronary lesion: Secondary | ICD-10-CM

## 2013-11-07 NOTE — Telephone Encounter (Signed)
New Message  Pt requests labs please put in orders if needed for 11/22/2013 @ 1:30pm

## 2013-11-07 NOTE — Telephone Encounter (Signed)
Lab work ordered

## 2013-11-07 NOTE — Telephone Encounter (Signed)
Lipid panel, CBC, BMET

## 2013-11-07 NOTE — Telephone Encounter (Signed)
Will forward to Dr.Skains to let us know what labs he would like ordered prior to this patient's appointment.

## 2013-11-17 ENCOUNTER — Encounter: Payer: Self-pay | Admitting: Cardiology

## 2013-11-17 DIAGNOSIS — E785 Hyperlipidemia, unspecified: Secondary | ICD-10-CM | POA: Insufficient documentation

## 2013-11-17 DIAGNOSIS — I240 Acute coronary thrombosis not resulting in myocardial infarction: Secondary | ICD-10-CM | POA: Insufficient documentation

## 2013-11-17 DIAGNOSIS — I10 Essential (primary) hypertension: Secondary | ICD-10-CM | POA: Insufficient documentation

## 2013-11-17 DIAGNOSIS — R0600 Dyspnea, unspecified: Secondary | ICD-10-CM | POA: Insufficient documentation

## 2013-11-20 ENCOUNTER — Other Ambulatory Visit: Payer: Self-pay

## 2013-11-20 MED ORDER — LISINOPRIL-HYDROCHLOROTHIAZIDE 20-12.5 MG PO TABS: 1.0000 | ORAL_TABLET | Freq: Every day | ORAL | Status: DC

## 2013-11-22 ENCOUNTER — Other Ambulatory Visit (INDEPENDENT_AMBULATORY_CARE_PROVIDER_SITE_OTHER): Payer: Managed Care, Other (non HMO)

## 2013-11-22 DIAGNOSIS — E782 Mixed hyperlipidemia: Secondary | ICD-10-CM

## 2013-11-22 DIAGNOSIS — I2584 Coronary atherosclerosis due to calcified coronary lesion: Secondary | ICD-10-CM

## 2013-11-22 LAB — CBC WITH DIFFERENTIAL/PLATELET
Basophils Absolute: 0 10*3/uL (ref 0.0–0.1)
Basophils Relative: 0.3 % (ref 0.0–3.0)
EOS PCT: 4.7 % (ref 0.0–5.0)
Eosinophils Absolute: 0.5 10*3/uL (ref 0.0–0.7)
HEMATOCRIT: 44.6 % (ref 39.0–52.0)
HEMOGLOBIN: 14.8 g/dL (ref 13.0–17.0)
LYMPHS ABS: 2.4 10*3/uL (ref 0.7–4.0)
Lymphocytes Relative: 24.7 % (ref 12.0–46.0)
MCHC: 33.2 g/dL (ref 30.0–36.0)
MCV: 85.2 fl (ref 78.0–100.0)
MONOS PCT: 12.4 % — AB (ref 3.0–12.0)
Monocytes Absolute: 1.2 10*3/uL — ABNORMAL HIGH (ref 0.1–1.0)
NEUTROS ABS: 5.6 10*3/uL (ref 1.4–7.7)
Neutrophils Relative %: 57.9 % (ref 43.0–77.0)
Platelets: 257 10*3/uL (ref 150.0–400.0)
RBC: 5.24 Mil/uL (ref 4.22–5.81)
RDW: 16 % — AB (ref 11.5–14.6)
WBC: 9.7 10*3/uL (ref 4.5–10.5)

## 2013-11-22 LAB — HEPATIC FUNCTION PANEL
ALBUMIN: 4.3 g/dL (ref 3.5–5.2)
ALT: 43 U/L (ref 0–53)
AST: 29 U/L (ref 0–37)
Alkaline Phosphatase: 64 U/L (ref 39–117)
Bilirubin, Direct: 0.1 mg/dL (ref 0.0–0.3)
Total Bilirubin: 0.8 mg/dL (ref 0.3–1.2)
Total Protein: 7 g/dL (ref 6.0–8.3)

## 2013-11-22 LAB — BASIC METABOLIC PANEL
BUN: 18 mg/dL (ref 6–23)
CHLORIDE: 101 meq/L (ref 96–112)
CO2: 29 mEq/L (ref 19–32)
Calcium: 9.1 mg/dL (ref 8.4–10.5)
Creatinine, Ser: 1.1 mg/dL (ref 0.4–1.5)
GFR: 66.86 mL/min (ref >60.00)
GLUCOSE: 105 mg/dL — AB (ref 70–99)
POTASSIUM: 4.2 meq/L (ref 3.5–5.1)
SODIUM: 137 meq/L (ref 135–145)

## 2013-11-22 LAB — LIPID PANEL
Cholesterol: 131 mg/dL (ref 0–200)
HDL: 50.4 mg/dL (ref >39.00)
LDL Cholesterol: 62 mg/dL (ref 0–99)
Total CHOL/HDL Ratio: 3
Triglycerides: 94 mg/dL (ref 0.0–149.0)
VLDL: 18.8 mg/dL (ref 0.0–40.0)

## 2013-11-25 ENCOUNTER — Ambulatory Visit (INDEPENDENT_AMBULATORY_CARE_PROVIDER_SITE_OTHER): Payer: Managed Care, Other (non HMO) | Admitting: Cardiology

## 2013-11-25 ENCOUNTER — Encounter: Payer: Self-pay | Admitting: Cardiology

## 2013-11-25 VITALS — BP 130/76 | HR 67 | Ht 68.0 in | Wt 207.0 lb

## 2013-11-25 DIAGNOSIS — I252 Old myocardial infarction: Secondary | ICD-10-CM

## 2013-11-25 DIAGNOSIS — I2510 Atherosclerotic heart disease of native coronary artery without angina pectoris: Secondary | ICD-10-CM

## 2013-11-25 DIAGNOSIS — I10 Essential (primary) hypertension: Secondary | ICD-10-CM

## 2013-11-25 DIAGNOSIS — R945 Abnormal results of liver function studies: Secondary | ICD-10-CM

## 2013-11-25 DIAGNOSIS — E785 Hyperlipidemia, unspecified: Secondary | ICD-10-CM

## 2013-11-25 DIAGNOSIS — R7989 Other specified abnormal findings of blood chemistry: Secondary | ICD-10-CM

## 2013-11-25 MED ORDER — ASPIRIN 81 MG PO TABS: 81.0000 mg | ORAL_TABLET | Freq: Every day | ORAL | Status: DC

## 2013-11-25 MED ORDER — LISINOPRIL-HYDROCHLOROTHIAZIDE 20-12.5 MG PO TABS: 1.0000 | ORAL_TABLET | Freq: Every day | ORAL | Status: DC

## 2013-11-25 NOTE — Patient Instructions (Addendum)
Your physician has recommended you make the following change in your medication:   Decrease ASA to 81 mg daily    Your physician wants you to follow-up in: 6 months with Dr. Marlou Porch also we will check a ALT You will receive a reminder letter in the mail two months in advance. If you don't receive a letter, please call our office to schedule the follow-up appointment.

## 2013-11-25 NOTE — Progress Notes (Signed)
[facility] [place]. [address], [address] [place], [place]  [address] Phone: [phone] Fax:  [phone]  Date:  11/25/2013   ID:  Thomas Farrell, DOB 1941/04/30, MRN [id number]  PCP:   Melinda Crutch, MD   History of Present Illness: Thomas Farrell is a 73 y.o. male with coronary artery disease who had successful stent to right coronary artery x2, highly calcified vessel here for followup. Last PCI was performed in March of 2013. First PCI was performed in 2009. His ALT has been mildly elevated in the past.  Talked about his hyperlipidemia. He has lost some weight..  Elevated ALT in the past. Currently ALT is 43. Excellent. LDL is 62.    Wt Readings from Last 3 Encounters:  11/25/13 207 lb (93.895 kg)  01/26/12 201 lb 8 oz (91.4 kg)  01/26/12 201 lb 8 oz (91.4 kg)     Past Medical History  Diagnosis Date  . Coronary artery disease   . Hypertension   . GERD (gastroesophageal reflux disease)   . Myocardial infarction   . RCA occlusion     , opened JV PROMUS (DES). LAD-proximal aneurysmal segment with otherwise minor  irregularities, left circumflex diffusely diseased ectatic, 100% occluded in the midsegment with left to left and right-to-left collaterals-no Clear Channel identified, right coronary artery 100% mid segment status post PCI to this area. LVEDP 14 mm mercury. EF 40-45% 2/09, Dr Marlou Porch  EF increased 50- 55%  . Obesity   . Hyperlipidemia   . Dyspnea   . Osteoarthritis     Past Surgical History  Procedure Laterality Date  . Cardiac catheterization    . Cardiac stents    . Coronary stent placement  01/25/2012    rca     . Hernia repair    . Tonsillectomy      Current Outpatient Prescriptions  Medication Sig Dispense Refill  . aspirin 325 MG tablet Take 325 mg by mouth daily.      . clopidogrel (PLAVIX) 75 MG tablet Take 1 tablet (75 mg total) by mouth daily.  30 tablet  3  . co-enzyme Q-10 30 MG capsule Take 200 mg by mouth daily.      [name] ezetimibe (ZETIA) 10 MG  tablet Take 1 tablet (10 mg total) by mouth daily.  30 tablet  6  . isosorbide mononitrate (IMDUR) 60 MG 24 hr tablet Take 1.5 tablets (90 mg total) by mouth daily.  45 tablet  5  . lisinopril-hydrochlorothiazide (PRINZIDE,ZESTORETIC) 20-12.5 MG per tablet Take 1 tablet by mouth daily.  30 tablet  3  . magnesium oxide (MAG-OX) 400 MG tablet Take 400 mg by mouth daily.      . metoprolol (LOPRESSOR) 50 MG tablet Take 1 tablet (50 mg total) by mouth 2 (two) times daily.  60 tablet  5  . nitroGLYCERIN (NITROSTAT) 0.4 MG SL tablet Place 0.4 mg under the tongue every 5 (five) minutes as needed for chest pain.      . pantoprazole (PROTONIX) 40 MG tablet Take 40 mg by mouth daily.      [name] POTASSIUM GLUCONATE PO Take 595 mg by mouth daily.      . rosuvastatin (CRESTOR) 40 MG tablet Take 1 tablet (40 mg total) by mouth daily.  30 tablet  3  . terbinafine (LAMISIL) 250 MG tablet Take 250 mg by mouth daily.       No current facility-administered medications for this visit.    Allergies:  Allergies  Allergen Reactions  . Acetaminophen Diarrhea and Nausea And Vomiting  . Niacin Other (See Comments)    flushing    Social History:  The patient  reports that he quit smoking about 11 years ago. He has never used smokeless tobacco. He reports that he drinks about 0.6 ounces of alcohol per week. He reports that he does not use illicit drugs.   ROS:  Please see the history of present illness.   Denies any bleeding, syncope, orthopnea, PND    PHYSICAL EXAM: VS:  BP 130/76  Pulse 67  Ht 5\' 8"  (1.727 m)  Wt 207 lb (93.895 kg)  BMI 31.48 kg/m2  SpO2 95% Well nourished, well developed, in no acute distress HEENT: normal Neck: no JVD Cardiac:  normal S1, S2; RRR; no murmur Lungs:  clear to auscultation bilaterally, no wheezing, rhonchi or rales Abd: soft, nontender, no hepatomegaly Ext: no edema Skin: warm and dry Neuro: no focal abnormalities noted  EKG:  None today, doing well     ASSESSMENT AND  PLAN:  1. 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 Coronary artery disease-occluded RCA as above. Continue with both Plavix and aspirin 81 mg. 3. Elevated LFTs-currently very well with ALT of 43. We will repeat in 6 months. 4. Hyperlipidemia -- LDL excellent at 62. Secondary prevention. Crestor, Zetia.  5. Hypertension-currently doing well. No changes made  Signed, Candee Furbish, MD [facility]  11/25/2013 3:19 PM

## 2013-12-20 ENCOUNTER — Other Ambulatory Visit: Payer: Self-pay | Admitting: *Deleted

## 2013-12-20 MED ORDER — NITROGLYCERIN 0.4 MG SL SUBL: 0.4000 mg | SUBLINGUAL_TABLET | SUBLINGUAL | Status: DC | PRN

## 2014-03-24 ENCOUNTER — Other Ambulatory Visit: Payer: Self-pay | Admitting: Cardiology

## 2014-05-01 ENCOUNTER — Other Ambulatory Visit: Payer: Self-pay | Admitting: Cardiology

## 2014-05-29 ENCOUNTER — Other Ambulatory Visit: Payer: Self-pay | Admitting: Cardiology

## 2014-05-29 ENCOUNTER — Other Ambulatory Visit: Payer: Self-pay

## 2014-05-29 MED ORDER — CLOPIDOGREL BISULFATE 75 MG PO TABS: ORAL_TABLET | ORAL | Status: DC

## 2014-06-04 ENCOUNTER — Ambulatory Visit (INDEPENDENT_AMBULATORY_CARE_PROVIDER_SITE_OTHER): Payer: Managed Care, Other (non HMO) | Admitting: Cardiology

## 2014-06-04 ENCOUNTER — Encounter (INDEPENDENT_AMBULATORY_CARE_PROVIDER_SITE_OTHER): Payer: Self-pay

## 2014-06-04 ENCOUNTER — Encounter: Payer: Self-pay | Admitting: Cardiology

## 2014-06-04 VITALS — BP 108/70 | HR 51 | Ht 68.5 in | Wt 201.0 lb

## 2014-06-04 DIAGNOSIS — I240 Acute coronary thrombosis not resulting in myocardial infarction: Secondary | ICD-10-CM

## 2014-06-04 DIAGNOSIS — I2510 Atherosclerotic heart disease of native coronary artery without angina pectoris: Secondary | ICD-10-CM

## 2014-06-04 DIAGNOSIS — I10 Essential (primary) hypertension: Secondary | ICD-10-CM

## 2014-06-04 DIAGNOSIS — E785 Hyperlipidemia, unspecified: Secondary | ICD-10-CM

## 2014-06-04 DIAGNOSIS — I219 Acute myocardial infarction, unspecified: Secondary | ICD-10-CM

## 2014-06-04 NOTE — Progress Notes (Signed)
[place]. [address] [place], [place]  [address] Phone: [phone] Fax:  [phone]  Date:  06/04/2014   ID:  Thomas Farrell, DOB 09/27/1941, MRN [id number]  PCP:   Melinda Crutch, MD   History of Present Illness: Thomas Farrell is a 73 y.o. male with coronary artery disease who had successful stent to right coronary artery x2, highly calcified vessel here for followup. Last PCI was performed in March of 2013. DES placement to distal right coronary artery with 3.0 x 20 mm Promus. First PCI was performed in 2009, also has occluded circumflex with left to left collaterals no change.[name] His ALT has been mildly elevated in the past.  Talked about his hyperlipidemia. He has lost some weight..  Elevated ALT in the past. Currently ALT is 43. Excellent. LDL is 62.    Wt Readings from Last 3 Encounters:  06/04/14 201 lb (91.173 kg)  11/25/13 207 lb (93.895 kg)  01/26/12 201 lb 8 oz (91.4 kg)     Past Medical History  Diagnosis Date  . Coronary artery disease   . Hypertension   . GERD (gastroesophageal reflux disease)   . Myocardial infarction   . RCA occlusion     , opened JV PROMUS (DES). LAD-proximal aneurysmal segment with otherwise minor  irregularities, left circumflex diffusely diseased ectatic, 100% occluded in the midsegment with left to left and right-to-left collaterals-no Clear Channel identified, right coronary artery 100% mid segment status post PCI to this area. LVEDP 14 mm mercury. EF 40-45% 2/09, Dr Marlou Porch  EF increased 50- 55%  . Obesity   . Hyperlipidemia   . Dyspnea   . Osteoarthritis     Past Surgical History  Procedure Laterality Date  . Cardiac catheterization    . Cardiac stents    . Coronary stent placement  01/25/2012    rca     . Hernia repair    . Tonsillectomy      Current Outpatient Prescriptions  Medication Sig Dispense Refill  . aspirin 81 MG tablet Take 1 tablet (81 mg total) by mouth daily.  90 tablet  3  . clopidogrel  (PLAVIX) 75 MG tablet TAKE ONE TABLET BY MOUTH ONCE DAILY  30 tablet  3  . co-enzyme Q-10 30 MG capsule Take 200 mg by mouth daily.      . CRESTOR 40 MG tablet TAKE ONE TABLET BY MOUTH ONCE DAILY  30 tablet  0  . isosorbide mononitrate (IMDUR) 60 MG 24 hr tablet TAKE ONE & ONE-HALF TABLETS BY MOUTH ONCE DAILY.  45 tablet  1  . lisinopril-hydrochlorothiazide (PRINZIDE,ZESTORETIC) 20-12.5 MG per tablet Take 1 tablet by mouth daily.  90 tablet  3  . magnesium oxide (MAG-OX) 400 MG tablet Take 400 mg by mouth daily.      . metoprolol (LOPRESSOR) 50 MG tablet TAKE ONE TABLET BY MOUTH TWICE DAILY  60 tablet  0  . nitroGLYCERIN (NITROSTAT) 0.4 MG SL tablet Place 1 tablet (0.4 mg total) under the tongue every 5 (five) minutes as needed for chest pain.  25 tablet  3  . pantoprazole (PROTONIX) 40 MG tablet Take 40 mg by mouth daily.      [name] POTASSIUM GLUCONATE PO Take 595 mg by mouth daily.      [name] ZETIA 10 MG tablet TAKE ONE TABLET BY MOUTH ONCE DAILY  30 tablet  1   No current facility-administered medications for this visit.    Allergies:  Allergies  Allergen Reactions  . Acetaminophen Diarrhea and Nausea And Vomiting  . Niacin Other (See Comments)    flushing    Social History:  The patient  reports that he quit smoking about 12 years ago. He has never used smokeless tobacco. He reports that he drinks about .6 ounces of alcohol per week. He reports that he does not use illicit drugs.   ROS:  Please see the history of present illness.   Denies any bleeding, syncope, orthopnea, PND    PHYSICAL EXAM: VS:  BP 108/70  Pulse 51  Ht 5' 8.5" (1.74 m)  Wt 201 lb (91.173 kg)  BMI 30.11 kg/m2 Well nourished, well developed, in no acute distress HEENT: normal Neck: no JVD Cardiac:  normal S1, S2; RRR; no murmur Lungs:  clear to auscultation bilaterally, no wheezing, rhonchi or rales Abd: soft, nontender, no hepatomegaly Ext: no edema Skin: warm and dry Neuro: no focal abnormalities  noted  EKG:  06/04/14: Sinus bradycardia rate 51, mild peaking of T waves in V2, V3. No significant ST changes, J-point elevation noted in V3, V4, V5.  Labs: 11/22/13: LDL 62, ALT 43 6/15: ALT 38  ASSESSMENT AND PLAN:  1. 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 Coronary artery disease without angina-occluded circumflex as above. Previously placed RCA stents. Continue with both Plavix and aspirin 81 mg. 3. Elevated LFTs-currently very well with ALT of 43, 38 on repeat. 4. Hyperlipidemia -- LDL excellent at 62. Secondary prevention. Crestor, Zetia. Doing well. 5. Hypertension-currently doing well. No changes made. 6. 1 year f/u  Signed, Candee Furbish, MD [facility]  06/04/2014 10:43 AM

## 2014-06-04 NOTE — Patient Instructions (Signed)
Your physician wants you to follow-up in: 12 months with Dr Dawna Part will receive a reminder letter in the mail two months in advance. If you don't receive a letter, please call our office to schedule the follow-up appointment.

## 2014-06-20 ENCOUNTER — Other Ambulatory Visit: Payer: Self-pay | Admitting: *Deleted

## 2014-06-20 MED ORDER — ROSUVASTATIN CALCIUM 40 MG PO TABS: ORAL_TABLET | ORAL | Status: DC

## 2014-07-07 ENCOUNTER — Other Ambulatory Visit: Payer: Self-pay | Admitting: Cardiology

## 2014-07-08 ENCOUNTER — Other Ambulatory Visit: Payer: Self-pay | Admitting: *Deleted

## 2014-07-08 MED ORDER — ISOSORBIDE MONONITRATE ER 60 MG PO TB24: ORAL_TABLET | ORAL | Status: DC

## 2014-07-08 MED ORDER — EZETIMIBE 10 MG PO TABS: ORAL_TABLET | ORAL | Status: DC

## 2014-07-18 ENCOUNTER — Other Ambulatory Visit: Payer: Self-pay

## 2014-07-18 MED ORDER — CLOPIDOGREL BISULFATE 75 MG PO TABS: ORAL_TABLET | ORAL | Status: DC

## 2014-10-30 ENCOUNTER — Encounter (HOSPITAL_COMMUNITY): Payer: Self-pay | Admitting: Interventional Cardiology

## 2014-12-18 ENCOUNTER — Other Ambulatory Visit: Payer: Self-pay | Admitting: Cardiology

## 2015-02-01 ENCOUNTER — Other Ambulatory Visit: Payer: Self-pay | Admitting: Cardiology

## 2015-04-08 ENCOUNTER — Other Ambulatory Visit: Payer: Self-pay | Admitting: Cardiology

## 2015-06-04 ENCOUNTER — Ambulatory Visit (INDEPENDENT_AMBULATORY_CARE_PROVIDER_SITE_OTHER): Payer: Medicare HMO | Admitting: Cardiology

## 2015-06-04 ENCOUNTER — Encounter: Payer: Self-pay | Admitting: Cardiology

## 2015-06-04 VITALS — BP 126/80 | HR 53 | Ht 67.0 in | Wt 209.8 lb

## 2015-06-04 DIAGNOSIS — I10 Essential (primary) hypertension: Secondary | ICD-10-CM

## 2015-06-04 DIAGNOSIS — I2510 Atherosclerotic heart disease of native coronary artery without angina pectoris: Secondary | ICD-10-CM | POA: Diagnosis not present

## 2015-06-04 DIAGNOSIS — I240 Acute coronary thrombosis not resulting in myocardial infarction: Secondary | ICD-10-CM

## 2015-06-04 DIAGNOSIS — I2111 ST elevation (STEMI) myocardial infarction involving right coronary artery: Secondary | ICD-10-CM

## 2015-06-04 DIAGNOSIS — E785 Hyperlipidemia, unspecified: Secondary | ICD-10-CM

## 2015-06-04 NOTE — Patient Instructions (Signed)
Medication Instructions:  Your physician recommends that you continue on your current medications as directed. Please refer to the Current Medication list given to you today.  Follow-Up: Follow up in 1 year with Dr. Skains.  You will receive a letter in the mail 2 months before you are due.  Please call us when you receive this letter to schedule your follow up appointment.  Thank you for choosing [facility]!!       

## 2015-06-04 NOTE — Progress Notes (Signed)
[facility]. [address] [place], [place]  [address] Phone: [phone] Fax:  [phone]  Date:  06/04/2015   ID:  Thomas Farrell, DOB 20-Dec-1940, MRN [id number]  PCP:   Melinda Crutch, MD   History of Present Illness: Thomas Farrell is a 74 y.o. male with coronary artery disease who had successful stent to right coronary artery x2, highly calcified vessel here for followup. Last PCI was performed in March of 2013. DES placement to distal right coronary artery with 3.0 x 20 mm Promus. First PCI was performed in 2009, also has occluded circumflex with left to left collaterals no change.Thomas Farrell His ALT has been mildly elevated in the past but upon recent check it was normal.  He takes care of his wife who is bedridden from prior stroke in 2013, March when he had his last stent placement. He has significant help at home as well. His wife's name is [name]. New bathtub, $6000. Still enjoying golf. [employer]. Excellent golfer.  Talked about his hyperlipidemia.  Elevated ALT in the past. Currently ALT is 48. Excellent. LDL is 62.    Wt Readings from Last 3 Encounters:  06/04/15 209 lb 12 oz (95.142 kg)  06/04/14 201 lb (91.173 kg)  11/25/13 207 lb (93.895 kg)     Past Medical History  Diagnosis Date  . Coronary artery disease   . Hypertension   . GERD (gastroesophageal reflux disease)   . Myocardial infarction   . RCA occlusion     , opened JV PROMUS (DES). LAD-proximal aneurysmal segment with otherwise minor  irregularities, left circumflex diffusely diseased ectatic, 100% occluded in the midsegment with left to left and right-to-left collaterals-no Clear Channel identified, right coronary artery 100% mid segment status post PCI to this area. LVEDP 14 mm mercury. EF 40-45% 2/09, Dr Marlou Porch  EF increased 50- 55%  . Obesity   . Hyperlipidemia   . Dyspnea   . Osteoarthritis     Past Surgical History  Procedure Laterality Date  . Cardiac catheterization    . Cardiac stents     . Coronary stent placement  01/25/2012    rca     . Hernia repair    . Tonsillectomy    . Percutaneous coronary stent intervention (pci-s) N/A 01/25/2012    Procedure: PERCUTANEOUS CORONARY STENT INTERVENTION (PCI-S);  Surgeon: Jettie Booze, MD;  Location: [facility] CATH LAB;  Service: Cardiovascular;  Laterality: N/A;    Current Outpatient Prescriptions  Medication Sig Dispense Refill  . aspirin 81 MG tablet Take 1 tablet (81 mg total) by mouth daily. 90 tablet 3  . clopidogrel (PLAVIX) 75 MG tablet TAKE ONE TABLET BY MOUTH ONCE DAILY 90 tablet 3  . co-enzyme Q-10 30 MG capsule Take 200 mg by mouth daily.    . isosorbide mononitrate (IMDUR) 60 MG 24 hr tablet TAKE ONE AND ONE-HALF TABLETS BY MOUTH ONCE DAILY 45 tablet 3  . lisinopril-hydrochlorothiazide (PRINZIDE,ZESTORETIC) 20-12.5 MG per tablet TAKE ONE TABLET BY MOUTH ONCE DAILY 90 tablet 1  . magnesium oxide (MAG-OX) 400 MG tablet Take 400 mg by mouth daily.    . metoprolol (LOPRESSOR) 50 MG tablet TAKE ONE TABLET BY MOUTH TWICE DAILY 60 tablet 3  . nitroGLYCERIN (NITROSTAT) 0.4 MG SL tablet Place 1 tablet (0.4 mg total) under the tongue every 5 (five) minutes as needed for chest pain. 25 tablet 3  . pantoprazole (PROTONIX) 40 MG tablet Take 40 mg by mouth daily.    Thomas Farrell  rosuvastatin (CRESTOR) 40 MG tablet TAKE ONE TABLET BY MOUTH ONCE DAILY 90 tablet 3  . tamsulosin (FLOMAX) 0.4 MG CAPS capsule     . ZETIA 10 MG tablet TAKE ONE TABLET BY MOUTH ONCE DAILY 30 tablet 3   No current facility-administered medications for this visit.    Allergies:    Allergies  Allergen Reactions  . Acetaminophen Diarrhea and Nausea And Vomiting  . Niacin Other (See Comments)    flushing    Social History:  The patient  reports that he quit smoking about 13 years ago. He has never used smokeless tobacco. He reports that he drinks about 0.6 oz of alcohol per week. He reports that he does not use illicit drugs.   ROS:  Please see the history of present  illness.   Denies any bleeding, syncope, orthopnea, PND    PHYSICAL EXAM: VS:  BP 126/80 mmHg  Pulse 53  Ht 5\' 7"  (1.702 m)  Wt 209 lb 12 oz (95.142 kg)  BMI 32.84 kg/m2 Well nourished, well developed, in no acute distress HEENT: normal Neck: no JVD Cardiac:  normal S1, S2; RRR; no murmur Lungs:  clear to auscultation bilaterally, no wheezing, rhonchi or rales Abd: soft, nontender, no hepatomegaly Ext: no edema Skin: warm and dry Neuro: no focal abnormalities noted  EKG:  Today, sinus bradycardia rate 53 with old inferior posterior infarct pattern personally viewed with mild peaking of T waves in V2, V3, no significant change from prior-06/04/14: Sinus bradycardia rate 51, mild peaking of T waves in V2, V3. No significant ST changes, J-point elevation noted in V3, V4, V5.  Labs: 05/28/15-LDL 67, HDL 55, creatinine 0.91 6/15: ALT 38  ASSESSMENT AND PLAN:  1. 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 Coronary artery disease without angina-occluded circumflex as above. Previously placed RCA stents. Continue with both Plavix and aspirin 81 mg. no signs of bleeding. 3. Elevated LFTs-currently very well with ALT of 43, 38 on repeat. Currently 48 4. Hyperlipidemia -- LDL excellent at 67. Secondary prevention. Crestor, Zetia. Doing well. No changes made. 5. Hypertension-currently doing well. No changes made. 6. 1 year f/u  Signed, Candee Furbish, MD [facility]  06/04/2015 11:20 AM

## 2015-06-08 ENCOUNTER — Other Ambulatory Visit: Payer: Self-pay | Admitting: Cardiology

## 2015-06-24 ENCOUNTER — Other Ambulatory Visit: Payer: Self-pay | Admitting: Cardiology

## 2015-08-08 ENCOUNTER — Other Ambulatory Visit: Payer: Self-pay | Admitting: Cardiology

## 2015-08-15 ENCOUNTER — Other Ambulatory Visit: Payer: Self-pay | Admitting: Cardiology

## 2015-09-16 ENCOUNTER — Other Ambulatory Visit: Payer: Self-pay | Admitting: Cardiology

## 2015-12-03 ENCOUNTER — Telehealth: Payer: Self-pay | Admitting: Cardiology

## 2015-12-03 MED ORDER — EZETIMIBE 10 MG PO TABS: 10.0000 mg | ORAL_TABLET | Freq: Every day | ORAL | Status: DC

## 2015-12-03 NOTE — Telephone Encounter (Signed)
New Message   Needs approval for [facility] prescriptions has been changed to EZETIMIBE  10mg  1x day

## 2016-01-22 ENCOUNTER — Encounter: Payer: Self-pay | Admitting: Gastroenterology

## 2016-01-27 ENCOUNTER — Other Ambulatory Visit: Payer: Self-pay | Admitting: Cardiology

## 2016-04-08 DIAGNOSIS — Z0000 Encounter for general adult medical examination without abnormal findings: Secondary | ICD-10-CM | POA: Diagnosis not present

## 2016-04-08 DIAGNOSIS — I259 Chronic ischemic heart disease, unspecified: Secondary | ICD-10-CM | POA: Diagnosis not present

## 2016-04-08 DIAGNOSIS — K219 Gastro-esophageal reflux disease without esophagitis: Secondary | ICD-10-CM | POA: Diagnosis not present

## 2016-04-08 DIAGNOSIS — E7800 Pure hypercholesterolemia, unspecified: Secondary | ICD-10-CM | POA: Diagnosis not present

## 2016-04-08 DIAGNOSIS — I209 Angina pectoris, unspecified: Secondary | ICD-10-CM | POA: Diagnosis not present

## 2016-04-08 DIAGNOSIS — I10 Essential (primary) hypertension: Secondary | ICD-10-CM | POA: Diagnosis not present

## 2016-04-20 ENCOUNTER — Encounter (HOSPITAL_COMMUNITY): Payer: Self-pay | Admitting: Emergency Medicine

## 2016-04-20 ENCOUNTER — Observation Stay (HOSPITAL_COMMUNITY)
Admission: EM | Admit: 2016-04-20 | Discharge: 2016-04-21 | Disposition: A | Discharge status: Home / Self Care | Payer: Medicare HMO | Attending: Internal Medicine | Admitting: Internal Medicine

## 2016-04-20 ENCOUNTER — Emergency Department (HOSPITAL_COMMUNITY): Payer: Medicare HMO

## 2016-04-20 DIAGNOSIS — Z79899 Other long term (current) drug therapy: Secondary | ICD-10-CM | POA: Insufficient documentation

## 2016-04-20 DIAGNOSIS — I10 Essential (primary) hypertension: Secondary | ICD-10-CM | POA: Diagnosis present

## 2016-04-20 DIAGNOSIS — N179 Acute kidney failure, unspecified: Secondary | ICD-10-CM | POA: Diagnosis present

## 2016-04-20 DIAGNOSIS — I2510 Atherosclerotic heart disease of native coronary artery without angina pectoris: Secondary | ICD-10-CM | POA: Insufficient documentation

## 2016-04-20 DIAGNOSIS — R072 Precordial pain: Principal | ICD-10-CM | POA: Insufficient documentation

## 2016-04-20 DIAGNOSIS — E669 Obesity, unspecified: Secondary | ICD-10-CM | POA: Diagnosis not present

## 2016-04-20 DIAGNOSIS — E785 Hyperlipidemia, unspecified: Secondary | ICD-10-CM | POA: Diagnosis present

## 2016-04-20 DIAGNOSIS — Z7982 Long term (current) use of aspirin: Secondary | ICD-10-CM | POA: Insufficient documentation

## 2016-04-20 DIAGNOSIS — R079 Chest pain, unspecified: Secondary | ICD-10-CM | POA: Diagnosis not present

## 2016-04-20 DIAGNOSIS — I252 Old myocardial infarction: Secondary | ICD-10-CM | POA: Diagnosis not present

## 2016-04-20 DIAGNOSIS — Z7901 Long term (current) use of anticoagulants: Secondary | ICD-10-CM | POA: Insufficient documentation

## 2016-04-20 DIAGNOSIS — Z87891 Personal history of nicotine dependence: Secondary | ICD-10-CM | POA: Insufficient documentation

## 2016-04-20 HISTORY — DX: Unspecified malignant neoplasm of skin of other parts of face: C44.309

## 2016-04-20 LAB — CBC
HCT: 41.8 % (ref 39.0–52.0)
HEMATOCRIT: 40.1 % (ref 39.0–52.0)
Hemoglobin: 13.7 g/dL (ref 13.0–17.0)
Hemoglobin: 13 g/dL (ref 13.0–17.0)
MCH: 28.9 pg (ref 26.0–34.0)
MCH: 28.5 pg (ref 26.0–34.0)
MCHC: 32.8 g/dL (ref 30.0–36.0)
MCHC: 32.4 g/dL (ref 30.0–36.0)
MCV: 88.2 fL (ref 78.0–100.0)
MCV: 87.9 fL (ref 78.0–100.0)
PLATELETS: 228 10*3/uL (ref 150–400)
PLATELETS: 204 10*3/uL (ref 150–400)
RBC: 4.74 MIL/uL (ref 4.22–5.81)
RBC: 4.56 MIL/uL (ref 4.22–5.81)
RDW: 14.5 % (ref 11.5–15.5)
RDW: 14.5 % (ref 11.5–15.5)
WBC: 9.7 10*3/uL (ref 4.0–10.5)
WBC: 9.5 10*3/uL (ref 4.0–10.5)

## 2016-04-20 LAB — BASIC METABOLIC PANEL
Anion gap: 8 (ref 5–15)
BUN: 20 mg/dL (ref 6–20)
CALCIUM: 9.2 mg/dL (ref 8.9–10.3)
CHLORIDE: 104 mmol/L (ref 101–111)
CO2: 24 mmol/L (ref 22–32)
CREATININE: 1.41 mg/dL — AB (ref 0.61–1.24)
GFR calc non Af Amer: 47 mL/min — ABNORMAL LOW (ref >60)
GFR, EST AFRICAN AMERICAN: 55 mL/min — AB (ref >60)
Glucose, Bld: 111 mg/dL — ABNORMAL HIGH (ref 65–99)
Potassium: 3.9 mmol/L (ref 3.5–5.1)
SODIUM: 136 mmol/L (ref 135–145)

## 2016-04-20 LAB — I-STAT TROPONIN, ED: TROPONIN I, POC: 0.02 ng/mL (ref 0.00–0.08)

## 2016-04-20 LAB — TROPONIN I: Troponin I: <0.03 ng/mL (ref <0.031)

## 2016-04-20 MED ORDER — HYDRALAZINE HCL 20 MG/ML IJ SOLN: 10.0000 mg | INTRAMUSCULAR | Status: DC | PRN

## 2016-04-20 MED ORDER — ROSUVASTATIN CALCIUM 10 MG PO TABS
40.0000 mg | ORAL_TABLET | Freq: Every day | ORAL | Status: DC
  Administered 2016-04-21: 40 mg via ORAL
  Filled 2016-04-20: qty 4

## 2016-04-20 MED ORDER — EZETIMIBE 10 MG PO TABS
10.0000 mg | ORAL_TABLET | Freq: Every day | ORAL | Status: DC
  Administered 2016-04-21: 10 mg via ORAL
  Filled 2016-04-20: qty 1

## 2016-04-20 MED ORDER — SODIUM CHLORIDE 0.9 % IV SOLN
INTRAVENOUS | Status: DC
Start: 2016-04-20 — End: 2016-04-21
  Administered 2016-04-20: via INTRAVENOUS
  Administered 2016-04-21: 1000 mL via INTRAVENOUS

## 2016-04-20 MED ORDER — MORPHINE SULFATE (PF) 2 MG/ML IV SOLN: 2.0000 mg | INTRAVENOUS | Status: DC | PRN

## 2016-04-20 MED ORDER — ONDANSETRON HCL 4 MG/2ML IJ SOLN: 4.0000 mg | Freq: Four times a day (QID) | INTRAMUSCULAR | Status: DC | PRN

## 2016-04-20 MED ORDER — CLOPIDOGREL BISULFATE 75 MG PO TABS
75.0000 mg | ORAL_TABLET | Freq: Every day | ORAL | Status: DC
  Administered 2016-04-21: 75 mg via ORAL
  Filled 2016-04-20: qty 1

## 2016-04-20 MED ORDER — METOPROLOL TARTRATE 50 MG PO TABS
50.0000 mg | ORAL_TABLET | Freq: Two times a day (BID) | ORAL | Status: DC
  Administered 2016-04-20 – 2016-04-21 (×2): 50 mg via ORAL
  Filled 2016-04-20 (×2): qty 1

## 2016-04-20 MED ORDER — NITROGLYCERIN 0.4 MG SL SUBL: 0.4000 mg | SUBLINGUAL_TABLET | SUBLINGUAL | Status: DC | PRN

## 2016-04-20 MED ORDER — NITROGLYCERIN 2 % TD OINT
1.0000 [in_us] | TOPICAL_OINTMENT | Freq: Once | TRANSDERMAL | Status: AC
  Administered 2016-04-20: 1 [in_us] via TOPICAL
  Filled 2016-04-20: qty 1

## 2016-04-20 MED ORDER — PANTOPRAZOLE SODIUM 40 MG PO TBEC
40.0000 mg | DELAYED_RELEASE_TABLET | Freq: Every day | ORAL | Status: DC
  Administered 2016-04-21: 40 mg via ORAL
  Filled 2016-04-20: qty 1

## 2016-04-20 MED ORDER — ENOXAPARIN SODIUM 40 MG/0.4ML ~~LOC~~ SOLN
40.0000 mg | Freq: Every day | SUBCUTANEOUS | Status: DC
  Administered 2016-04-21: 40 mg via SUBCUTANEOUS
  Filled 2016-04-20: qty 0.4

## 2016-04-20 MED ORDER — TERBINAFINE HCL 250 MG PO TABS
250.0000 mg | ORAL_TABLET | Freq: Every day | ORAL | Status: DC
  Administered 2016-04-21: 250 mg via ORAL
  Filled 2016-04-20: qty 1

## 2016-04-20 MED ORDER — ISOSORBIDE MONONITRATE ER 60 MG PO TB24
90.0000 mg | ORAL_TABLET | Freq: Every day | ORAL | Status: DC
  Administered 2016-04-21: 90 mg via ORAL
  Filled 2016-04-20: qty 1

## 2016-04-20 MED ORDER — POTASSIUM GLUCONATE 595 MG PO CAPS: 595.0000 mg | ORAL_CAPSULE | Freq: Every day | ORAL | Status: DC

## 2016-04-20 MED ORDER — MAGNESIUM OXIDE 400 (241.3 MG) MG PO TABS
400.0000 mg | ORAL_TABLET | Freq: Every day | ORAL | Status: DC
  Administered 2016-04-21: 400 mg via ORAL
  Filled 2016-04-20: qty 1

## 2016-04-20 MED ORDER — ASPIRIN 325 MG PO TABS
325.0000 mg | ORAL_TABLET | Freq: Every day | ORAL | Status: DC
  Administered 2016-04-21: 325 mg via ORAL
  Filled 2016-04-20: qty 1

## 2016-04-20 NOTE — H&P (Signed)
History and Physical    Thomas Farrell [id number] DOB: 1941/01/03 DOA: 04/20/2016  PCP:  Melinda Crutch, MD  Patient coming from: Home.  Chief Complaint: Chest pain.  HPI: Thomas Farrell is a 75 y.o. male with medical history significant of CAD status post stenting, hypertension and hyperlipidemia presents to the ER because of chest pain. Patient started to experience chest pain this evening at his house while at rest. Denies any associated shortness of breath productive cough fever or chills. Patient took aspirin and nitroglycerin initially there was only but subsequently chest pain resolved. Cardiac markers chest x-ray and EKG were unremarkable and patient has been admitted for further management of chest pain.   ED Course: Chest x-ray cardiac markers and EKG were done.  Review of Systems: As per HPI otherwise 10 point review of systems negative.    Past Medical History  Diagnosis Date  . Coronary artery disease   . Hypertension   . GERD (gastroesophageal reflux disease)   . Myocardial infarction ([place])   . 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 LAD-proximal aneurysmal segment with otherwise minor  irregularities, left circumflex diffusely diseased ectatic, 100% occluded in the midsegment with left to left and right-to-left collaterals-no Clear Channel identified, right coronary artery 100% mid segment status post PCI to this area. LVEDP 14 mm mercury. EF 40-45% 2/09, Dr Marlou Porch  EF increased 50- 55%  . Obesity   . Hyperlipidemia   . Dyspnea   . Osteoarthritis     Past Surgical History  Procedure Laterality Date  . Cardiac catheterization    . Cardiac stents    . Coronary stent placement  01/25/2012    rca     . Hernia repair    . Tonsillectomy    . Percutaneous coronary stent intervention (pci-s) N/A 01/25/2012    Procedure: PERCUTANEOUS CORONARY STENT INTERVENTION (PCI-S);  Surgeon: Jettie Booze, MD;  Location: [facility] CATH LAB;  Service: Cardiovascular;   Laterality: N/A;     reports that he quit smoking about 14 years ago. He has never used smokeless tobacco. He reports that he drinks about 0.6 oz of alcohol per week. He reports that he does not use illicit drugs.  Allergies  Allergen Reactions  . Acetaminophen Diarrhea and Nausea And Vomiting  . Niacin Other (See Comments)    flushing    Family History  Problem Relation Age of Onset  . Hypertension Other     Prior to Admission medications   Medication Sig Start Date End Date Taking? Authorizing Provider  aspirin 325 MG tablet Take 325 mg by mouth daily.   Yes Historical Provider, MD  clopidogrel (PLAVIX) 75 MG tablet TAKE ONE TABLET BY MOUTH ONCE DAILY 01/27/16  Yes Jerline Pain, MD  co-enzyme Q-10 30 MG capsule Take 200 mg by mouth daily.   Yes Historical Provider, MD  CRESTOR 40 MG tablet TAKE ONE TABLET BY MOUTH ONCE DAILY 08/17/15  Yes Jerline Pain, MD  ezetimibe (ZETIA) 10 MG tablet Take 1 tablet (10 mg total) by mouth daily. 12/03/15  Yes Jerline Pain, MD  isosorbide mononitrate (IMDUR) 60 MG 24 hr tablet TAKE ONE AND ONE-HALF TABLETS BY MOUTH ONCE DAILY 06/25/15  Yes Jerline Pain, MD  lisinopril-hydrochlorothiazide (PRINZIDE,ZESTORETIC) 20-12.5 MG tablet TAKE ONE TABLET BY MOUTH ONCE DAILY 09/16/15  Yes Jerline Pain, MD  magnesium oxide (MAG-OX) 400 MG tablet Take 400 mg by mouth daily.   Yes Historical Provider,  MD  metoprolol (LOPRESSOR) 50 MG tablet TAKE ONE TABLET BY MOUTH TWICE DAILY 06/09/15  Yes Jerline Pain, MD  nitroGLYCERIN (NITROSTAT) 0.4 MG SL tablet Place 1 tablet (0.4 mg total) under the tongue every 5 (five) minutes as needed for chest pain. 12/20/13  Yes Jerline Pain, MD  pantoprazole (PROTONIX) 40 MG tablet Take 40 mg by mouth daily.   Yes Historical Provider, MD  Potassium Gluconate 595 MG CAPS Take 595 mg by mouth daily.   Yes Historical Provider, MD  terbinafine (LAMISIL) 250 MG tablet Take 250 mg by mouth daily.   Yes Historical Provider, MD    Physical  Exam: Filed Vitals:   04/20/16 2130 04/20/16 2145 04/20/16 2200 04/20/16 2226  BP: 155/75 146/78 150/82 140/71  Pulse: 58 54 55 55  Temp:    98 F (36.7 C)  TempSrc:    Oral  Resp: 13 15 15 16   Height:    5\' 8"  (1.727 m)  Weight:    202 lb (91.627 kg)  SpO2: 99% 99% 98% 99%      Constitutional: Not in distress. Chest pain-free. Filed Vitals:   04/20/16 2130 04/20/16 2145 04/20/16 2200 04/20/16 2226  BP: 155/75 146/78 150/82 140/71  Pulse: 58 54 55 55  Temp:    98 F (36.7 C)  TempSrc:    Oral  Resp: 13 15 15 16   Height:    5\' 8"  (1.727 m)  Weight:    202 lb (91.627 kg)  SpO2: 99% 99% 98% 99%   Eyes: Anicteric no pallor. ENMT: No discharge from the ears eyes nose and mouth. Neck: No mass felt. No JVD appreciated. Respiratory: No rhonchi or crepitations. Cardiovascular: S1 and S2 heard. Abdomen: Soft nontender bowel sounds present. Musculoskeletal: No edema. Skin: No rash. Neurologic: Alert awake oriented to time place and person. Moves all extremities. Psychiatric: Appears normal.   Labs on Admission: I have personally reviewed following labs and imaging studies  CBC:  Recent Labs Lab 04/20/16 1802  WBC 9.7  HGB 13.7  HCT 41.8  MCV 88.2  PLT [id number]   Basic Metabolic Panel:  Recent Labs Lab 04/20/16 1802  NA 136  K 3.9  CL 104  CO2 24  GLUCOSE 111*  BUN 20  CREATININE 1.41*  CALCIUM 9.2   GFR: Estimated Creatinine Clearance: 49.7 mL/min (by C-G formula based on Cr of 1.41). Liver Function Tests: No results for input(s): AST, ALT, ALKPHOS, BILITOT, PROT, ALBUMIN in the last 168 hours. No results for input(s): LIPASE, AMYLASE in the last 168 hours. No results for input(s): AMMONIA in the last 168 hours. Coagulation Profile: No results for input(s): INR, PROTIME in the last 168 hours. Cardiac Enzymes:  Recent Labs Lab 04/20/16 2120  TROPONINI <0.03   BNP (last 3 results) No results for input(s): PROBNP in the last 8760 hours. HbA1C: No  results for input(s): HGBA1C in the last 72 hours. CBG: No results for input(s): GLUCAP in the last 168 hours. Lipid Profile: No results for input(s): CHOL, HDL, LDLCALC, TRIG, CHOLHDL, LDLDIRECT in the last 72 hours. Thyroid Function Tests: No results for input(s): TSH, T4TOTAL, FREET4, T3FREE, THYROIDAB in the last 72 hours. Anemia Panel: No results for input(s): VITAMINB12, FOLATE, FERRITIN, TIBC, IRON, RETICCTPCT in the last 72 hours. Urine analysis: No results found for: COLORURINE, APPEARANCEUR, LABSPEC, PHURINE, GLUCOSEU, HGBUR, BILIRUBINUR, KETONESUR, PROTEINUR, UROBILINOGEN, NITRITE, LEUKOCYTESUR Sepsis Labs: @LABRCNTIP (procalcitonin:4,lacticidven:4) )No results found for this or any previous visit (from the past 240 hour(s)).  Radiological Exams on Admission: Dg Chest 2 View  04/20/2016  CLINICAL DATA:  Chest pain EXAM: CHEST  2 VIEW COMPARISON:  February 25, 2009 FINDINGS: The heart size and mediastinal contours are within normal limits. Both lungs are clear. The visualized skeletal structures are unremarkable. IMPRESSION: No active cardiopulmonary disease. Electronically Signed   By: Dorise Bullion III M.D   On: 04/20/2016 18:31    EKG: Independently reviewed. Normal sinus rhythm with nonspecific ST-T changes.  Assessment/Plan Principal Problem:   Chest pain Active Problems:   Hypertension   Hyperlipidemia   ARF (acute renal failure) (HCC)   Pain in the chest    #1. Chest pain concerning for angina - presently chest pain-free. We will cycle cardiac markers continue antiplatelet agents, Crestor. Mildly bradycardic not on beta blockers. Keep patient nothing by mouth in anticipation of cardiac procedures. #2. Acute renal failure - gently hydrate. Check UA. Will hold off lisinopril and hydrochlorothiazide for now. Follow metabolic panel. #3. Hypertension - since patient has acute renal failure and in anticipation of possible cardiac cath would hold off lisinopril and  hydrochlorothiazide and keep patient on when necessary IV hydralazine for now. #4. Hyperlipidemia on Crestor and Zetia.   DVT prophylaxis: Lovenox. Code Status: Full code.  Family Communication: No family at the bedside.  Disposition Plan: Home.  Consults called: Cardiology consult through email.  Admission status: Observation. Telemetry.    Rise Patience MD [facility] Pager [phone].  If 7PM-7AM, please contact night-coverage [email or web address] Password [email or web address]  04/20/2016, 10:59 PM

## 2016-04-20 NOTE — ED Notes (Signed)
Pt sts mid sternal CP x 2 hours; pt sts took ASA and nitro with some relief

## 2016-04-20 NOTE — ED Provider Notes (Signed)
CSN: [id number]     Arrival date & time 04/20/16  1751 History   First MD Initiated Contact with Patient 04/20/16 2058     Chief Complaint  Patient presents with  . Chest Pain  PT IS HERE WITH MID STERNAL CP. THE PT SAID THAT HE FEELS LIKE IT GOES INTO HIS JAW.  THE PT HAS A HX OF CAD WITH A STENT TO RCA TIMES 2.  LAST PCI IN 2013.  1ST IN 2009.  PT ALSO HAS AN OCCLUDED CIRCUMFLEX WITH COLLATERALS.  PT SAID THAT HE TOOK ASA AND NTG PTA.  INITIALLY CP DID NOT GO AWAY, BUT IS NOW IMPROVING.   (Consider location/radiation/quality/duration/timing/severity/associated sxs/prior Treatment) The history is provided by the patient.    Past Medical History  Diagnosis Date  . Coronary artery disease   . Hypertension   . GERD (gastroesophageal reflux disease)   . Myocardial infarction ([place])   . 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 LAD-proximal aneurysmal segment with otherwise minor  irregularities, left circumflex diffusely diseased ectatic, 100% occluded in the midsegment with left to left and right-to-left collaterals-no Clear Channel identified, right coronary artery 100% mid segment status post PCI to this area. LVEDP 14 mm mercury. EF 40-45% 2/09, Dr Marlou Porch  EF increased 50- 55%  . Obesity   . Hyperlipidemia   . Dyspnea   . Osteoarthritis    Past Surgical History  Procedure Laterality Date  . Cardiac catheterization    . Cardiac stents    . Coronary stent placement  01/25/2012    rca     . Hernia repair    . Tonsillectomy    . Percutaneous coronary stent intervention (pci-s) N/A 01/25/2012    Procedure: PERCUTANEOUS CORONARY STENT INTERVENTION (PCI-S);  Surgeon: Jettie Booze, MD;  Location: [facility] CATH LAB;  Service: Cardiovascular;  Laterality: N/A;   History reviewed. No pertinent family history. Social History  Substance Use Topics  . Smoking status: Former Smoker    Quit date: 01/24/2002  . Smokeless tobacco: Never Used  . Alcohol Use: 0.6 oz/week    1 Glasses of  wine per week     Comment: ocassional    Review of Systems  Cardiovascular: Positive for chest pain.  All other systems reviewed and are negative.     Allergies  Acetaminophen and Niacin  Home Medications   Prior to Admission medications   Medication Sig Start Date End Date Taking? Authorizing Provider  aspirin 81 MG tablet Take 1 tablet (81 mg total) by mouth daily. 11/25/13   Jerline Pain, MD  clopidogrel (PLAVIX) 75 MG tablet TAKE ONE TABLET BY MOUTH ONCE DAILY 01/27/16   Jerline Pain, MD  co-enzyme Q-10 30 MG capsule Take 200 mg by mouth daily.    Historical Provider, MD  CRESTOR 40 MG tablet TAKE ONE TABLET BY MOUTH ONCE DAILY 08/17/15   Jerline Pain, MD  ezetimibe (ZETIA) 10 MG tablet Take 1 tablet (10 mg total) by mouth daily. 12/03/15   Jerline Pain, MD  isosorbide mononitrate (IMDUR) 60 MG 24 hr tablet TAKE ONE AND ONE-HALF TABLETS BY MOUTH ONCE DAILY 06/25/15   Jerline Pain, MD  lisinopril-hydrochlorothiazide (PRINZIDE,ZESTORETIC) 20-12.5 MG tablet TAKE ONE TABLET BY MOUTH ONCE DAILY 09/16/15   Jerline Pain, MD  magnesium oxide (MAG-OX) 400 MG tablet Take 400 mg by mouth daily.    Historical Provider, MD  metoprolol (LOPRESSOR) 50 MG tablet TAKE ONE TABLET BY MOUTH  TWICE DAILY 06/09/15   Jerline Pain, MD  nitroGLYCERIN (NITROSTAT) 0.4 MG SL tablet Place 1 tablet (0.4 mg total) under the tongue every 5 (five) minutes as needed for chest pain. 12/20/13   Jerline Pain, MD  pantoprazole (PROTONIX) 40 MG tablet Take 40 mg by mouth daily.    Historical Provider, MD  tamsulosin (FLOMAX) 0.4 MG CAPS capsule  05/28/15   Historical Provider, MD   BP 165/92 mmHg  Pulse 58  Temp(Src) 98.5 F (36.9 C) (Oral)  Resp 18  SpO2 97% Physical Exam  Constitutional: He is oriented to person, place, and time. He appears well-developed and well-nourished.  HENT:  Head: Normocephalic and atraumatic.  Right Ear: External ear normal.  Left Ear: External ear normal.  Nose: Nose normal.   Mouth/Throat: Oropharynx is clear and moist.  Eyes: Conjunctivae and EOM are normal. Pupils are equal, round, and reactive to light.  Neck: Normal range of motion. Neck supple.  Cardiovascular: Normal rate, regular rhythm, normal heart sounds and intact distal pulses.   Pulmonary/Chest: Effort normal and breath sounds normal.  Abdominal: Soft. Bowel sounds are normal.  Musculoskeletal: Normal range of motion.  Neurological: He is alert and oriented to person, place, and time.  Skin: Skin is warm and dry.  Psychiatric: He has a normal mood and affect. His behavior is normal. Judgment and thought content normal.  Nursing note and vitals reviewed.   ED Course  Procedures (including critical care time) Labs Review Labs Reviewed  BASIC METABOLIC PANEL - Abnormal; Notable for the following:    Glucose, Bld 111 (*)    Creatinine, Ser 1.41 (*)    GFR calc non Af Amer 47 (*)    GFR calc Af Amer 55 (*)    All other components within normal limits  CBC  TROPONIN I  TROPONIN I  TROPONIN I  I-STAT TROPOININ, ED    Imaging Review Dg Chest 2 View  04/20/2016  CLINICAL DATA:  Chest pain EXAM: CHEST  2 VIEW COMPARISON:  February 25, 2009 FINDINGS: The heart size and mediastinal contours are within normal limits. Both lungs are clear. The visualized skeletal structures are unremarkable. IMPRESSION: No active cardiopulmonary disease. Electronically Signed   By: Dorise Bullion III M.D   On: 04/20/2016 18:31   I have personally reviewed and evaluated these images and lab results as part of my medical decision-making.   EKG Interpretation   Date/Time:  Wednesday Apr 20 2016 17:54:49 EDT Ventricular Rate:  66 PR Interval:  156 QRS Duration: 92 QT Interval:  406 QTC Calculation: 425 R Axis:   -8 Text Interpretation:  Normal sinus rhythm Minimal voltage criteria for  LVH, may be normal variant Lateral infarct , age undetermined Inferior  infarct , age undetermined Abnormal ECG Confirmed by  [facility] MD, Dmarco Baldus  ([id number]) on 04/20/2016 8:58:35 PM      MDM  PT D/W DR. Hal Hope ([facility]) WHO WILL ADMIT PT FOR CP OBS.   Final diagnoses:  Chest pain, unspecified chest pain type       Isla Pence, MD 04/20/16 2113

## 2016-04-21 ENCOUNTER — Telehealth: Payer: Self-pay | Admitting: Physician Assistant

## 2016-04-21 ENCOUNTER — Observation Stay (HOSPITAL_COMMUNITY): Payer: Medicare HMO

## 2016-04-21 ENCOUNTER — Other Ambulatory Visit: Payer: Self-pay | Admitting: Physician Assistant

## 2016-04-21 ENCOUNTER — Observation Stay (HOSPITAL_BASED_OUTPATIENT_CLINIC_OR_DEPARTMENT_OTHER): Payer: Medicare HMO

## 2016-04-21 DIAGNOSIS — I10 Essential (primary) hypertension: Secondary | ICD-10-CM | POA: Diagnosis not present

## 2016-04-21 DIAGNOSIS — R079 Chest pain, unspecified: Secondary | ICD-10-CM

## 2016-04-21 DIAGNOSIS — Q208 Other congenital malformations of cardiac chambers and connections: Secondary | ICD-10-CM

## 2016-04-21 DIAGNOSIS — I25118 Atherosclerotic heart disease of native coronary artery with other forms of angina pectoris: Secondary | ICD-10-CM | POA: Diagnosis not present

## 2016-04-21 DIAGNOSIS — R072 Precordial pain: Secondary | ICD-10-CM

## 2016-04-21 DIAGNOSIS — E785 Hyperlipidemia, unspecified: Secondary | ICD-10-CM

## 2016-04-21 LAB — NM MYOCAR MULTI W/SPECT W/WALL MOTION / EF
CHL CUP NUCLEAR SDS: 3
CHL CUP NUCLEAR SRS: 15
CHL CUP NUCLEAR SSS: 18
LV sys vol: 75 mL
LVDIAVOL: 123 mL (ref 62–150)
NUC STRESS TID: 1.11
Peak HR: 69 {beats}/min
RATE: 0.3
Rest HR: 53 {beats}/min

## 2016-04-21 LAB — URINALYSIS, ROUTINE W REFLEX MICROSCOPIC
GLUCOSE, UA: NEGATIVE mg/dL
HGB URINE DIPSTICK: NEGATIVE
Ketones, ur: 15 mg/dL — AB
Leukocytes, UA: NEGATIVE
Nitrite: NEGATIVE
Protein, ur: NEGATIVE mg/dL
SPECIFIC GRAVITY, URINE: 1.027 (ref 1.005–1.030)
pH: 5.5 (ref 5.0–8.0)

## 2016-04-21 LAB — TROPONIN I: Troponin I: <0.03 ng/mL (ref <0.031)

## 2016-04-21 LAB — CREATININE, SERUM
CREATININE: 1.34 mg/dL — AB (ref 0.61–1.24)
GFR calc non Af Amer: 50 mL/min — ABNORMAL LOW (ref >60)
GFR, EST AFRICAN AMERICAN: 58 mL/min — AB (ref >60)

## 2016-04-21 MED ORDER — TECHNETIUM TC 99M TETROFOSMIN IV KIT
10.0000 | PACK | Freq: Once | INTRAVENOUS | Status: AC | PRN
  Administered 2016-04-21: 10 via INTRAVENOUS

## 2016-04-21 MED ORDER — TECHNETIUM TC 99M TETROFOSMIN IV KIT
30.0000 | PACK | Freq: Once | INTRAVENOUS | Status: AC | PRN
  Administered 2016-04-21: 30 via INTRAVENOUS

## 2016-04-21 MED ORDER — REGADENOSON 0.4 MG/5ML IV SOLN
INTRAVENOUS | Status: AC
  Administered 2016-04-21: 0.4 mg
  Filled 2016-04-21: qty 5

## 2016-04-21 NOTE — Plan of Care (Signed)
Problem: Education: Goal: Knowledge of [facility] Education information/materials will improve Outcome: Completed/Met Date Met:  04/21/16 Pt educated throughout entire hospitalization regarding tests, procedures, lab results, medications, and available resources   Problem: Consults Goal: Chest Pain Patient Education (See Patient Education module for education specifics.) Outcome: Completed/Met Date Met:  04/21/16 Pt received education regarding chest pain   Problem: Discharge Progression Outcomes Goal: No anginal pain Outcome: Completed/Met Date Met:  04/21/16 Pt remains free from chest pain  Goal: Discharge plan in place and appropriate Outcome: Completed/Met Date Met:  04/21/16 Pt being discharged home today Goal: Tolerates diet Outcome: Completed/Met Date Met:  04/21/16 Pt able to tolerate current diet with no difficulties  Goal: Activity appropriate for discharge plan Outcome: Not Applicable Date Met:  64/31/42 Pt activity is the same as on admission, no new issues   Problem: Pain Managment: Goal: General experience of comfort will improve Outcome: Not Applicable Date Met:  76/70/11 Pt remains free from pain

## 2016-04-21 NOTE — Discharge Instructions (Signed)
Nonspecific Chest Pain  °Chest pain can be caused by many different conditions. There is always a chance that your pain could be related to something serious, such as a heart attack or a blood clot in your lungs. Chest pain can also be caused by conditions that are not life-threatening. If you have chest pain, it is very important to follow up with your health care provider. °CAUSES  °Chest pain can be caused by: °· Heartburn. °· Pneumonia or bronchitis. °· Anxiety or stress. °· Inflammation around your heart (pericarditis) or lung (pleuritis or pleurisy). °· A blood clot in your lung. °· A collapsed lung (pneumothorax). It can develop suddenly on its own (spontaneous pneumothorax) or from trauma to the chest. °· Shingles infection (varicella-zoster virus). °· Heart attack. °· Damage to the bones, muscles, and cartilage that make up your chest wall. This can include: °¨ Bruised bones due to injury. °¨ Strained muscles or cartilage due to frequent or repeated coughing or overwork. °¨ Fracture to one or more ribs. °¨ Sore cartilage due to inflammation (costochondritis). °RISK FACTORS  °Risk factors for chest pain may include: °· Activities that increase your risk for trauma or injury to your chest. °· Respiratory infections or conditions that cause frequent coughing. °· Medical conditions or overeating that can cause heartburn. °· Heart disease or family history of heart disease. °· Conditions or health behaviors that increase your risk of developing a blood clot. °· Having had chicken pox (varicella zoster). °SIGNS AND SYMPTOMS °Chest pain can feel like: °· Burning or tingling on the surface of your chest or deep in your chest. °· Crushing, pressure, aching, or squeezing pain. °· Dull or sharp pain that is worse when you move, cough, or take a deep breath. °· Pain that is also felt in your back, neck, shoulder, or arm, or pain that spreads to any of these areas. °Your chest pain may come and go, or it may stay  constant. °DIAGNOSIS °Lab tests or other studies may be needed to find the cause of your pain. Your health care provider may have you take a test called an ambulatory ECG (electrocardiogram). An ECG records your heartbeat patterns at the time the test is performed. You may also have other tests, such as: °· Transthoracic echocardiogram (TTE). During echocardiography, sound waves are used to create a picture of all of the heart structures and to look at how blood flows through your heart. °· Transesophageal echocardiogram (TEE). This is a more advanced imaging test that obtains images from inside your body. It allows your health care provider to see your heart in finer detail. °· Cardiac monitoring. This allows your health care provider to monitor your heart rate and rhythm in real time. °· Holter monitor. This is a portable device that records your heartbeat and can help to diagnose abnormal heartbeats. It allows your health care provider to track your heart activity for several days, if needed. °· Stress tests. These can be done through exercise or by taking medicine that makes your heart beat more quickly. °· Blood tests. °· Imaging tests. °TREATMENT  °Your treatment depends on what is causing your chest pain. Treatment may include: °· Medicines. These may include: °¨ Acid blockers for heartburn. °¨ Anti-inflammatory medicine. °¨ Pain medicine for inflammatory conditions. °¨ Antibiotic medicine, if an infection is present. °¨ Medicines to dissolve blood clots. °¨ Medicines to treat coronary artery disease. °· Supportive care for conditions that do not require medicines. This may include: °¨ Resting. °¨ Applying heat   or cold packs to injured areas. °¨ Limiting activities until pain decreases. °HOME CARE INSTRUCTIONS °· If you were prescribed an antibiotic medicine, finish it all even if you start to feel better. °· Avoid any activities that bring on chest pain. °· Do not use any tobacco products, including  cigarettes, chewing tobacco, or electronic cigarettes. If you need help quitting, ask your health care provider. °· Do not drink alcohol. °· Take medicines only as directed by your health care provider. °· Keep all follow-up visits as directed by your health care provider. This is important. This includes any further testing if your chest pain does not go away. °· If heartburn is the cause for your chest pain, you may be told to keep your head raised (elevated) while sleeping. This reduces the chance that acid will go from your stomach into your esophagus. °· Make lifestyle changes as directed by your health care provider. These may include: °¨ Getting regular exercise. Ask your health care provider to suggest some activities that are safe for you. °¨ Eating a heart-healthy diet. A registered dietitian can help you to learn healthy eating options. °¨ Maintaining a healthy weight. °¨ Managing diabetes, if necessary. °¨ Reducing stress. °SEEK MEDICAL CARE IF: °· Your chest pain does not go away after treatment. °· You have a rash with blisters on your chest. °· You have a fever. °SEEK IMMEDIATE MEDICAL CARE IF:  °· Your chest pain is worse. °· You have an increasing cough, or you cough up blood. °· You have severe abdominal pain. °· You have severe weakness. °· You faint. °· You have chills. °· You have sudden, unexplained chest discomfort. °· You have sudden, unexplained discomfort in your arms, back, neck, or jaw. °· You have shortness of breath at any time. °· You suddenly start to sweat, or your skin gets clammy. °· You feel nauseous or you vomit. °· You suddenly feel light-headed or dizzy. °· Your heart begins to beat quickly, or it feels like it is skipping beats. °These symptoms may represent a serious problem that is an emergency. Do not wait to see if the symptoms will go away. Get medical help right away. Call your local emergency services (911 in the [place] Do not drive yourself to the hospital. °  °This  information is not intended to replace advice given to you by your health care provider. Make sure you discuss any questions you have with your health care provider. °  °Document Released: 08/17/2005 Document Revised: 11/28/2014 Document Reviewed: 06/13/2014 °Elsevier Interactive Patient Education ©2016 [employer] ° °

## 2016-04-21 NOTE — Discharge Summary (Signed)
Physician Discharge Summary  Thomas Farrell  [id number]  DOB: 03-09-1941  DOA: 04/20/2016  PCP:  Melinda Crutch, MD  Admit date: 04/20/2016 Discharge date: 04/21/2016  Time spent: Greater than 30 minutes  Recommendations for Outpatient Follow-up:  1. Dr. Lawerance Cruel, PCP in 3 days with repeat labs (CBC & BMP). 2. [facility] cardiology: MDs office will call patient with appointment for outpatient 2-D echocardiogram and follow-up.  Discharge Diagnoses:  Principal Problem:   Chest pain Active Problems:   Hypertension   Hyperlipidemia   ARF (acute renal failure) (HCC)   Pain in the chest   Discharge Condition: Improved & Stable  Diet recommendation: Heart healthy diet.  Filed Weights   04/20/16 2226 04/21/16 0424  Weight: 91.627 kg (202 lb) 91.491 kg (201 lb 11.2 oz)    History of present illness & Hospital course:  75 year old male with history of CAD, HTN, GERD, HLD presented to [facility] ED on 04/20/16 with complaints of chest discomfort. He sees Dr. Marlou Porch, Cardiology. His last cath was in 2013, received DES to distal RCA, had cutting balloon angioplasty of in-stent restenosis in the mid RCA. On day of admission, he developed substernal chest pain while sitting and watching TV at about 3 PM. He describes this chest pain as tightness in the center of the chest without radiation, associated dyspnea, diaphoresis, lightheadedness or dizziness. He states that he took it" couple of nitroglycerin tablets" sublingually and chest discomfort eventually resolved over about 3 hours. He is physically very active, goes up and down 13 flight of stairs at home on a regular basis and takes care of his disabled wife. He was admitted to telemetry which showed sinus bradycardia in the 50s-sinus rhythm and her 12 beat nonsustained VT early this morning. EKG showed inferior Q waves. Troponin was cycled 4 and negative. Cardiology was consulted and he underwent nuclear stress test. Discussed with Dr. Sallyanne Kuster,  Cardiology regarding stress test results and he indicated that this was old scar without ischemia (low risk). Prior LVEF by cath was normal in 2013 but EF on current stress test was 39%. He recommended discharging patient home and will arrange outpatient follow-up in office with repeat 2-D echo to review LV function. Patient will continue prior home dose of aspirin, Plavix, statin, as a when necessary, Imdur and metoprolol.  Chronic kidney disease: Patient presented with creatinine of 1.41. Baseline is not known. BUN normal suggesting that this may be chronic. Last creatinine in Epic is from 11/22/13:1.1. Initially felt to be acute kidney injury and hence lisinopril and HCTZ were held and patient was hydrated. Suspect that he may have some degree of chronic kidney disease. Recommended that he continue lisinopril-HCTZ at prior home doses and follow-up with PCP in the next 3 days with repeat BMP at which time determination can be made if this medication needs to be continued or not. Discussed this at length with patient and he verbalized understanding.  Essential hypertension: Continue prior home dose of lisinopril-HCTZ, metoprolol and Imdur. Mildly uncontrolled. Outpatient follow-up regarding management.  Hyperlipidemia: Continue statins and Zetia.  GERD: Continue PPI.  CAD status post CABG: Management as discussed above.  NSVT: Patient had an episode of 12 beat asymptomatic NSVT on telemetry. Continue metoprolol. Outpatient follow-up with cardiology.   Consultations:  Cardiology  Procedures:  None    Discharge Exam:  Complaints:  Seen this morning prior to stress test. Denied any further chest discomfort or dyspnea.  Filed Vitals:   04/21/16 1400 04/21/16 1413 04/21/16 1415  04/21/16 1417  BP: 131/76 154/91 144/89 155/88  Pulse: 54 62 68 59  Temp:      TempSrc:      Resp:      Height:      Weight:      SpO2:      Temperature 1F, respiratory rate 16/m, oxygen saturation  97%.  General exam: Pleasant elderly male sitting up comfortably in chair this morning. Respiratory system: Clear. No increased work of breathing. Cardiovascular system: S1 & S2 heard, RRR. No JVD, murmurs, gallops, clicks or pedal edema. Telemetry: SB in the 50s-SR. 12 beat NSVT at 5:36 AM. Gastrointestinal system: Abdomen is nondistended, soft and nontender. Normal bowel sounds heard. Central nervous system: Alert and oriented. No focal neurological deficits. Extremities: Symmetric 5 x 5 power.  Discharge Instructions      Discharge Instructions    Call MD for:  difficulty breathing, headache or visual disturbances    Complete by:  As directed      Call MD for:  extreme fatigue    Complete by:  As directed      Call MD for:  persistant dizziness or light-headedness    Complete by:  As directed      Call MD for:  severe uncontrolled pain    Complete by:  As directed      Diet - low sodium heart healthy    Complete by:  As directed      Increase activity slowly    Complete by:  As directed             Medication List    TAKE these medications        aspirin 325 MG tablet  Take 325 mg by mouth daily.     clopidogrel 75 MG tablet  Commonly known as:  PLAVIX  TAKE ONE TABLET BY MOUTH ONCE DAILY     co-enzyme Q-10 30 MG capsule  Take 200 mg by mouth daily.     CRESTOR 40 MG tablet  Generic drug:  rosuvastatin  TAKE ONE TABLET BY MOUTH ONCE DAILY     ezetimibe 10 MG tablet  Commonly known as:  ZETIA  Take 1 tablet (10 mg total) by mouth daily.     isosorbide mononitrate 60 MG 24 hr tablet  Commonly known as:  IMDUR  TAKE ONE AND ONE-HALF TABLETS BY MOUTH ONCE DAILY     lisinopril-hydrochlorothiazide 20-12.5 MG tablet  Commonly known as:  PRINZIDE,ZESTORETIC  TAKE ONE TABLET BY MOUTH ONCE DAILY     magnesium oxide 400 MG tablet  Commonly known as:  MAG-OX  Take 400 mg by mouth daily.     metoprolol 50 MG tablet  Commonly known as:  LOPRESSOR  TAKE ONE  TABLET BY MOUTH TWICE DAILY     nitroGLYCERIN 0.4 MG SL tablet  Commonly known as:  NITROSTAT  Place 1 tablet (0.4 mg total) under the tongue every 5 (five) minutes as needed for chest pain.     pantoprazole 40 MG tablet  Commonly known as:  PROTONIX  Take 40 mg by mouth daily.     Potassium Gluconate 595 MG Caps  Take 595 mg by mouth daily.     terbinafine 250 MG tablet  Commonly known as:  LAMISIL  Take 250 mg by mouth daily.       Follow-up Information    Follow up with  Melinda Crutch, MD. Schedule an appointment as soon as possible for a visit in 3 days.  Specialty:  Family Medicine   Why:  To be seen with repeat labs (CBC & BMP).   Contact information:   [place] [address] [phone]       Follow up with [facility].   Why:  MDs office will call you with appointment for outpatient echocardiogram and follow-up.   Contact information:   [place] [phone]      Get Medicines reviewed and adjusted: Please take all your medications with you for your next visit with your Primary MD  Please request your Primary MD to go over all hospital tests and procedure/radiological results at the follow up. Please ask your Primary MD to get all Hospital records sent to his/her office.  If you experience worsening of your admission symptoms, develop shortness of breath, life threatening emergency, suicidal or homicidal thoughts you must seek medical attention immediately by calling 911 or calling your MD immediately if symptoms less severe.  You must read complete instructions/literature along with all the possible adverse reactions/side effects for all the Medicines you take and that have been prescribed to you. Take any new Medicines after you have completely understood and accept all the possible adverse reactions/side effects.   Do not drive when taking  pain medications.   Do not take more than prescribed Pain, Sleep and Anxiety Medications  Special Instructions: If you have smoked or chewed Tobacco in the last 2 yrs please stop smoking, stop any regular Alcohol and or any Recreational drug use.  Wear Seat belts while driving.  Please note  You were cared for by a hospitalist during your hospital stay. Once you are discharged, your primary care physician will handle any further medical issues. Please note that NO REFILLS for any discharge medications will be authorized once you are discharged, as it is imperative that you return to your primary care physician (or establish a relationship with a primary care physician if you do not have one) for your aftercare needs so that they can reassess your need for medications and monitor your lab values.    The results of significant diagnostics from this hospitalization (including imaging, microbiology, ancillary and laboratory) are listed below for reference.    Significant Diagnostic Studies: Dg Chest 2 View  04/20/2016  CLINICAL DATA:  Chest pain EXAM: CHEST  2 VIEW COMPARISON:  February 25, 2009 FINDINGS: The heart size and mediastinal contours are within normal limits. Both lungs are clear. The visualized skeletal structures are unremarkable. IMPRESSION: No active cardiopulmonary disease. Electronically Signed   By: Dorise Bullion III M.D   On: 04/20/2016 18:31   Nm Myocar Multi W/spect W/wall Motion / Ef  04/21/2016   There was no ST segment deviation noted during stress.  There is a medium defect of moderate severity present in the basal inferior, mid inferior and apical inferior location. 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 No ischemia.  There is a medium defect of severe severity present in the basal inferolateral, mid inferolateral and apical lateral location. 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 No ischemia.  The left ventricular  ejection fraction is moderately decreased (30-44%).  Nuclear stress EF: 39%. There is severe hypo to akinesis of the inferior and inferolateral walls c/w prior infarct.  This is a low risk study.    04/21/2016   There was no ST segment deviation noted during stress.  There is a medium defect  of moderate severity present in the basal inferior, mid inferior and apical inferior location. 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 No ischemia.  There is a medium defect of severe severity present in the basal inferolateral, mid inferolateral and apical lateral location. 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 No ischemia.  The left ventricular ejection fraction is moderately decreased (30-44%).  Nuclear stress EF: 39%. There is severe hypo to akinesis of the inferior and inferolateral walls c/w prior infarct.  This is a low risk study.    04/21/2016   There was no ST segment deviation noted during stress.  There is a medium defect of moderate severity present in the basal inferior, mid inferior and apical inferior location. 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 No ischemia.  There is a medium defect of severe severity present in the basal inferolateral, mid inferolateral and apical lateral location. 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 No ischemia.  The left ventricular ejection fraction is moderately decreased (30-44%).  Nuclear stress EF: 39%.  This is a low risk study.     Microbiology: No results found for this or any previous visit (from the past 240 hour(s)).   Labs: Basic Metabolic Panel:  Recent Labs Lab 04/20/16 1802 04/20/16 2315  NA 136  --   K 3.9  --   CL 104  --   CO2 24  --   GLUCOSE 111*  --   BUN 20  --   CREATININE 1.41* 1.34*  CALCIUM 9.2  --    Liver Function Tests: No results for input(s): AST, ALT, ALKPHOS, BILITOT, PROT, ALBUMIN in the last 168 hours. No results  for input(s): LIPASE, AMYLASE in the last 168 hours. No results for input(s): AMMONIA in the last 168 hours. CBC:  Recent Labs Lab 04/20/16 1802 04/20/16 2315  WBC 9.7 9.5  HGB 13.7 13.0  HCT 41.8 40.1  MCV 88.2 87.9  PLT 228 204   Cardiac Enzymes:  Recent Labs Lab 04/20/16 2120 04/20/16 2315 04/21/16 0507 04/21/16 1029  TROPONINI <0.03 <0.03 <0.03 <0.03   BNP: BNP (last 3 results) No results for input(s): BNP in the last 8760 hours.  ProBNP (last 3 results) No results for input(s): PROBNP in the last 8760 hours.  CBG: No results for input(s): GLUCAP in the last 168 hours.     Signed:  Vernell Leep, MD, FACP, FHM. [facility] Pager [phone]  If 7PM-7AM, please contact night-coverage [email or web address] Password [email or web address] 04/21/2016, 5:50 PM

## 2016-04-21 NOTE — Telephone Encounter (Signed)
Patient is being discharged from hospital today. Please arrange outpatient echo for abnormal LV function and chest pain.   Can we see if we can move up his appt with Dr. Marlou Porch? Otherwise please schedule PA appt 2-3 weeks on a [place] day. Please call him with this info. Thanks. Dayna Dunn PA-C

## 2016-04-21 NOTE — Consult Note (Signed)
CARDIOLOGY CONSULT NOTE   Patient ID: Thomas Farrell MRN: [id number] DOB/AGE: July 31, 1941 75 y.o.  Admit date: 04/20/2016  Primary Physician    Melinda Crutch, MD Primary Cardiologist: Dr. Marlou Porch Requesting MD: Dr. Hal Hope Reason for Consultation: Chest pain  HPI: [name] is a 75 year old male with a past medical history of CAD, HTN, and HLD.   His last cath was in 2013, received DES to distal RCA, had cutting balloon angioplasty of in stent restenosis in the mid RCA.   He was sitting at home yesterday, he developed substernal chest pain at 3pm. He describes the pain as heaviness and tightness. It was in the central portion of her chest, no radiation. Denies associated SOB, diaphoresis, and nausea. He reports that with his previous MI in 2013, he never had chest pain but just felt SOB. He does not feel SOB today.   Pain lasted for about 3 hours, was relieved by 2 SL Nitro.   He is very active at home, he works in the yard and plays golf regularly. He never gets any chest pain with activity. He takes care of his ill wife at home.   EKG shows inferior Q waves, troponin negative x 3.    Past Medical History  Diagnosis Date  . Coronary artery disease   . Hypertension   . GERD (gastroesophageal reflux disease)   . 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 LAD-proximal aneurysmal segment with otherwise minor  irregularities, left circumflex diffusely diseased ectatic, 100% occluded in the midsegment with left to left and right-to-left collaterals-no Clear Channel identified, right coronary artery 100% mid segment status post PCI to this area. LVEDP 14 mm mercury. EF 40-45% 2/09, Dr Marlou Porch  EF increased 50- 55%  . Obesity   . Hyperlipidemia   . Dyspnea   . Osteoarthritis   . Cancer of skin of temple     "right"  . Myocardial infarction [facility]) 2009     Past Surgical History  Procedure Laterality Date  . Inguinal hernia repair Right ~ 2010  . Tonsillectomy    .  Percutaneous coronary stent intervention (pci-s) N/A 01/25/2012    Procedure: PERCUTANEOUS CORONARY STENT INTERVENTION (PCI-S);  Surgeon: Jettie Booze, MD;  Location: [facility] CATH LAB;  Service: Cardiovascular;  Laterality: N/A;  . Skin cancer excision Right     temple  . Cardiac catheterization  2009;  12/2011  . Coronary angioplasty with stent placement  2009; 01/2012    "~ 2wk after each cath"    Allergies  Allergen Reactions  . Acetaminophen Diarrhea and Nausea And Vomiting  . Niacin Other (See Comments)    flushing    I have reviewed the patient's current medications . aspirin  325 mg Oral Daily  . clopidogrel  75 mg Oral Daily  . enoxaparin (LOVENOX) injection  40 mg Subcutaneous Daily  . ezetimibe  10 mg Oral Daily  . isosorbide mononitrate  90 mg Oral Daily  . magnesium oxide  400 mg Oral Daily  . metoprolol  50 mg Oral BID  . pantoprazole  40 mg Oral Daily  . rosuvastatin  40 mg Oral Daily  . terbinafine  250 mg Oral Daily   . sodium chloride 1,000 mL (04/21/16 1011)   hydrALAZINE, morphine injection, nitroGLYCERIN, ondansetron (ZOFRAN) IV  Prior to Admission medications   Medication Sig Start Date End Date Taking? Authorizing Provider  aspirin 325 MG tablet Take 325 mg by mouth  daily.   Yes Historical Provider, MD  clopidogrel (PLAVIX) 75 MG tablet TAKE ONE TABLET BY MOUTH ONCE DAILY 01/27/16  Yes Jerline Pain, MD  co-enzyme Q-10 30 MG capsule Take 200 mg by mouth daily.   Yes Historical Provider, MD  CRESTOR 40 MG tablet TAKE ONE TABLET BY MOUTH ONCE DAILY 08/17/15  Yes Jerline Pain, MD  ezetimibe (ZETIA) 10 MG tablet Take 1 tablet (10 mg total) by mouth daily. 12/03/15  Yes Jerline Pain, MD  isosorbide mononitrate (IMDUR) 60 MG 24 hr tablet TAKE ONE AND ONE-HALF TABLETS BY MOUTH ONCE DAILY 06/25/15  Yes Jerline Pain, MD  lisinopril-hydrochlorothiazide (PRINZIDE,ZESTORETIC) 20-12.5 MG tablet TAKE ONE TABLET BY MOUTH ONCE DAILY 09/16/15  Yes Jerline Pain, MD  magnesium  oxide (MAG-OX) 400 MG tablet Take 400 mg by mouth daily.   Yes Historical Provider, MD  metoprolol (LOPRESSOR) 50 MG tablet TAKE ONE TABLET BY MOUTH TWICE DAILY 06/09/15  Yes Jerline Pain, MD  nitroGLYCERIN (NITROSTAT) 0.4 MG SL tablet Place 1 tablet (0.4 mg total) under the tongue every 5 (five) minutes as needed for chest pain. 12/20/13  Yes Jerline Pain, MD  pantoprazole (PROTONIX) 40 MG tablet Take 40 mg by mouth daily.   Yes Historical Provider, MD  Potassium Gluconate 595 MG CAPS Take 595 mg by mouth daily.   Yes Historical Provider, MD  terbinafine (LAMISIL) 250 MG tablet Take 250 mg by mouth daily.   Yes Historical Provider, MD     Social History   Social History  . Marital Status: Married    Spouse Name: N/A  . Number of Children: N/A  . Years of Education: N/A   Occupational History  . Not on file.   Social History Main Topics  . Smoking status: Former Smoker -- 4.00 packs/day for 43 years    Types: Cigarettes    Quit date: 01/24/2002  . Smokeless tobacco: Never Used  . Alcohol Use: 8.4 oz/week    14 Glasses of wine per week  . Drug Use: No  . Sexual Activity: Not Currently   Other Topics Concern  . Not on file   Social History Narrative    No family status information on file.   Family History  Problem Relation Age of Onset  . Hypertension Other      ROS:  Full 14 point review of systems complete and found to be negative unless listed above.  Physical Exam: Blood pressure 132/75, pulse 60, temperature 98.8 F (37.1 C), temperature source Oral, resp. rate 16, height 5\' 8"  (1.727 m), weight 201 lb 11.2 oz (91.491 kg), SpO2 98 %.  General: Well developed, well nourished, male in no acute distress Head: Eyes PERRLA, No xanthomas. Normocephalic and atraumatic, oropharynx without edema or exudate.  Lungs: CTA Heart: HRRR S1 S2, no rub/gallop, No murmur. pulses are 2+ extrem.   Neck: No carotid bruits. No lymphadenopathy.  No JVD. Abdomen: Bowel sounds present,  abdomen soft and non-tender without masses or hernias noted. Msk:  No spine or cva tenderness. No weakness, no joint deformities or effusions. Extremities: No clubbing or cyanosis.  No edema.  Neuro: Alert and oriented X 3. No focal deficits noted. Psych:  Good affect, responds appropriately Skin: No rashes or lesions noted.  Labs:   Lab Results  Component Value Date   WBC 9.5 04/20/2016   HGB 13.0 04/20/2016   HCT 40.1 04/20/2016   MCV 87.9 04/20/2016   PLT 204 04/20/2016  Recent Labs Lab 04/20/16 1802 04/20/16 2315  NA 136  --   K 3.9  --   CL 104  --   CO2 24  --   BUN 20  --   CREATININE 1.41* 1.34*  CALCIUM 9.2  --   GLUCOSE 111*  --     Recent Labs  04/20/16 2120 04/20/16 2315 04/21/16 0507  TROPONINI <0.03 <0.03 <0.03    Recent Labs  04/20/16 1826  TROPIPOC 0.02   Echo: pending  ECG: NSR  Radiology:  Dg Chest 2 View  04/20/2016  CLINICAL DATA:  Chest pain EXAM: CHEST  2 VIEW COMPARISON:  February 25, 2009 FINDINGS: The heart size and mediastinal contours are within normal limits. Both lungs are clear. The visualized skeletal structures are unremarkable. IMPRESSION: No active cardiopulmonary disease. Electronically Signed   By: Dorise Bullion III M.D   On: 04/20/2016 18:31    ASSESSMENT AND PLAN:    Principal Problem:   Chest pain Active Problems:   Hypertension   Hyperlipidemia   ARF (acute renal failure) (HCC)   Pain in the chest  1. Unstable angina: patient has known CAD, last cath was in 2013 for in stent restenosis in his RCA. He presents with chest pain at rest that lasted about 3 hours. We will plan for [facility] today. Troponin negative x 2. He is on Plavix currently. We will await results of study. Echo pending.   2. HLD: Last LDL was 62 in 2015. Continue high intensity statin and Zetia.  3. HTN: Well controlled on beta blocker and isosorbide.   4. Chronic kidney disease: Patient has known CKD, GFR is 50, baseline creatinine is  1.4. Not on ACE or ARB. Patient is on terbinafine, consider discontinuing therapy as it can be nephrotoxic.   Signed: Arbutus Leas, NP 04/21/2016 10:14 AM Pager [phone]  I have seen and examined the patient along with Arbutus Leas, NP.  I have reviewed the chart, notes and new data.  I agree with NP's note.  Key new complaints: no chest discomfort at this time; prior presentation was with dyspnea and ECG showed a completed inferior MI, that led to cath and PCI. He has not typically had angina Key examination changes: no overt CHF, no arrhythmia Key new findings / data: normal enzymes, ECG with old inferior MI, but no ST-T changes. Unable to locate old images/reports of nuclear testing at [facility]. Narrow mediastinum and clear lungs on CXR.  PLAN: Myoview study today. If there is reversible ischemia, coronary angio tomorrow. If the study shows only fixed inferior wall scar, may discharge tonight, if he remains asymptomatic and BP OK.  Sanda Klein, MD, [place] [phone] 04/21/2016, 11:43 AM

## 2016-04-21 NOTE — Plan of Care (Signed)
Problem: Education: Goal: Knowledge of [facility] Education information/materials will improve Outcome: Progressing Patient aware of plan of care.  RN provided medication education to patient on all medications administered thus far this shift.  Patient stated understanding.  Since arriving to unit patient has denied chest pain.

## 2016-04-21 NOTE — Progress Notes (Signed)
Lexiscan nuc completed, day 1 of 1, images pending. Thaddius Manes PA-C

## 2016-04-21 NOTE — Progress Notes (Signed)
Pt being discharged home with daughter.  reviewed discharge instructions and education, all questions answered.  Assessment unchanged from earlier.

## 2016-04-21 NOTE — Progress Notes (Signed)
Reviewed nuc result with Dr. Sallyanne Kuster. Old scar, no ischemia. Prior LVEF was normal by cath in 2013 so will get echo to clarify EF since it was 39% by nuc. Patient aware of result. I have sent a message to our [facility] office's scheduler requesting a follow-up appointment, and our office will call the patient with this information. IM made aware.  Dayna Dunn PA-C

## 2016-04-29 DIAGNOSIS — R079 Chest pain, unspecified: Secondary | ICD-10-CM | POA: Diagnosis not present

## 2016-04-29 DIAGNOSIS — N179 Acute kidney failure, unspecified: Secondary | ICD-10-CM | POA: Diagnosis not present

## 2016-05-11 ENCOUNTER — Other Ambulatory Visit: Payer: Self-pay

## 2016-05-11 ENCOUNTER — Ambulatory Visit (HOSPITAL_COMMUNITY): Payer: Medicare HMO | Attending: Cardiovascular Disease

## 2016-05-11 DIAGNOSIS — R079 Chest pain, unspecified: Secondary | ICD-10-CM | POA: Insufficient documentation

## 2016-05-11 DIAGNOSIS — Z6830 Body mass index (BMI) 30.0-30.9, adult: Secondary | ICD-10-CM | POA: Diagnosis not present

## 2016-05-11 DIAGNOSIS — Q208 Other congenital malformations of cardiac chambers and connections: Secondary | ICD-10-CM

## 2016-05-11 DIAGNOSIS — Q249 Congenital malformation of heart, unspecified: Secondary | ICD-10-CM | POA: Insufficient documentation

## 2016-05-11 DIAGNOSIS — I358 Other nonrheumatic aortic valve disorders: Secondary | ICD-10-CM | POA: Insufficient documentation

## 2016-05-11 DIAGNOSIS — I119 Hypertensive heart disease without heart failure: Secondary | ICD-10-CM | POA: Diagnosis not present

## 2016-05-11 DIAGNOSIS — E669 Obesity, unspecified: Secondary | ICD-10-CM | POA: Diagnosis not present

## 2016-05-11 DIAGNOSIS — I2510 Atherosclerotic heart disease of native coronary artery without angina pectoris: Secondary | ICD-10-CM | POA: Diagnosis not present

## 2016-05-11 DIAGNOSIS — I071 Rheumatic tricuspid insufficiency: Secondary | ICD-10-CM | POA: Diagnosis not present

## 2016-05-11 DIAGNOSIS — I340 Nonrheumatic mitral (valve) insufficiency: Secondary | ICD-10-CM | POA: Insufficient documentation

## 2016-05-11 DIAGNOSIS — E785 Hyperlipidemia, unspecified: Secondary | ICD-10-CM | POA: Diagnosis not present

## 2016-05-11 LAB — ECHOCARDIOGRAM COMPLETE
AVLVOTPG: 3 mmHg
Ao-asc: 33 cm
CHL CUP DOP CALC LVOT VTI: 19.8 cm
CHL CUP MV DEC (S): 254
CHL CUP RV SYS PRESS: 25 mmHg
CHL CUP TV REG PEAK VELOCITY: 235 cm/s
E decel time: 254 msec
E/e' ratio: 7.53
FS: 23 % — AB (ref 28–44)
IV/PV OW: 1.43
LA diam end sys: 39 mm
LA diam index: 1.84 cm/m2
LA vol A4C: 72 ml
LA vol: 67 mL
LASIZE: 39 mm
LAVOLIN: 31.6 mL/m2
LDCA: 2.54 cm2
LV E/e' medial: 7.53
LV E/e'average: 7.53
LV PW d: 8.39 mm — AB (ref 0.6–1.1)
LV TDI E'LATERAL: 8.19
LV e' LATERAL: 8.19 cm/s
LV sys vol index: 25 mL/m2
LV sys vol: 52 mL (ref 21–61)
LVDIAVOL: 104 mL (ref 62–150)
LVDIAVOLIN: 49 mL/m2
LVOTD: 18 mm
LVOTPV: 83.5 cm/s
LVOTSV: 50 mL
Lateral S' vel: 13.3 cm/s
MV pk A vel: 53.1 m/s
MV pk E vel: 61.7 m/s
Simpson's disk: 50
Stroke v: 52 ml
TDI e' medial: 5.07
TR max vel: 235 cm/s

## 2016-05-16 ENCOUNTER — Encounter: Payer: Self-pay | Admitting: Physician Assistant

## 2016-05-16 ENCOUNTER — Ambulatory Visit (INDEPENDENT_AMBULATORY_CARE_PROVIDER_SITE_OTHER): Payer: Medicare HMO | Admitting: Physician Assistant

## 2016-05-16 VITALS — BP 109/58 | HR 72 | Ht 68.0 in | Wt 196.1 lb

## 2016-05-16 DIAGNOSIS — I2510 Atherosclerotic heart disease of native coronary artery without angina pectoris: Secondary | ICD-10-CM | POA: Diagnosis not present

## 2016-05-16 DIAGNOSIS — N183 Chronic kidney disease, stage 3 unspecified: Secondary | ICD-10-CM

## 2016-05-16 DIAGNOSIS — E785 Hyperlipidemia, unspecified: Secondary | ICD-10-CM | POA: Diagnosis not present

## 2016-05-16 DIAGNOSIS — I10 Essential (primary) hypertension: Secondary | ICD-10-CM

## 2016-05-16 MED ORDER — ASPIRIN EC 81 MG PO TBEC: 81.0000 mg | DELAYED_RELEASE_TABLET | Freq: Every day | ORAL | Status: DC

## 2016-05-16 NOTE — Patient Instructions (Signed)
Medication Instructions:  Your physician has recommended you make the following change in your medication:  1.  STOP the Aspirin 325 mg 2.  START the Aspirin 81 mg taking 1 daily    Labwork: None ordered  Testing/Procedures: None ordered  Follow-Up: Your physician recommends that you schedule a follow-up appointment in: SEE DR. Marlou Porch AS PLANNED   Any Other Special Instructions Will Be Listed Below (If Applicable).     If you need a refill on your cardiac medications before your next appointment, please call your pharmacy.

## 2016-05-16 NOTE — Progress Notes (Signed)
Cardiology Office Note    Date:  05/16/2016  ID:  Thomas Farrell, Thomas Farrell 27-Jul-1941, MRN [id number] PCP:   Melinda Crutch, MD  Cardiologist:  Marlou Porch   Chief Complaint: f/u chest pain  History of Present Illness:  Thomas Farrell is a 75 y.o. male with history of CAD (2009 - s/p PTCA/DES to RCA with wire-induced dissection in distal RCA, 2013 - s/p DES to [facility] and cutting balloon angioplasty to ISR to [facility]), mild cardiomyopathy with EF 45-50% in 2009 (50-55% by echo 04/2016), suspected CKD stage III (Cr 1.3-1.4 in 04/2016), HTN, HLD. GERD, obesity who presents for post-hospital follow-up. He was admitted earlier this month with CP and ruled out for MI. He was noted to have 12 beats of WCT. K 3.9. Nuc 04/21/16 showed prior infarct but no ischemia, LVEF 39%. 2D echo was obtained as outpatient 05/11/16 with mild focal basal hypertrophy of the septum, EF 50-55% with mild HK of the inferolateral and inferior myocardium, mod LAE.   He returns for followup feeling "great." No further CP. No SOB. He has been back out on the golf course without difficulty. Initial BP by the tech was 90/60 but this was with large cuff - recheck by me with regular appropriate cuff was 109/58. He states it usually runs 115/70s at home. He denies any dizziness or syncope. He reports that he f/u with his PCP already and was told that his kidney function was better.    Past Medical History  Diagnosis Date  . Hypertension   . GERD (gastroesophageal reflux disease)   . CAD in native artery     a. 2009 - s/p PTCA/DES to RCA with wire-induced dissection in distal RCA. b. 2013 - s/p DES to dRCA and cutting balloon angioplasty to ISR to mRCA  . Obesity   . Hyperlipidemia   . Osteoarthritis   . Cancer of skin of temple     "right"  . Myocardial infarction ([place]) 2009  . Cardiomyopathy ([place])     a. EF 45-50% in 2009. b. 50-55% by echo 04/2016.  [name] CKD (chronic kidney disease), stage III     Past Surgical History  Procedure  Laterality Date  . Inguinal hernia repair Right ~ 2010  . Tonsillectomy    . Percutaneous coronary stent intervention (pci-s) N/A 01/25/2012    Procedure: PERCUTANEOUS CORONARY STENT INTERVENTION (PCI-S);  Surgeon: Jettie Booze, MD;  Location: [facility] CATH LAB;  Service: Cardiovascular;  Laterality: N/A;  . Skin cancer excision Right     temple  . Cardiac catheterization  2009;  12/2011  . Coronary angioplasty with stent placement  2009; 01/2012    "~ 2wk after each cath"    Current Medications: Current Outpatient Prescriptions  Medication Sig Dispense Refill  . clopidogrel (PLAVIX) 75 MG tablet TAKE ONE TABLET BY MOUTH ONCE DAILY 90 tablet 1  . co-enzyme Q-10 30 MG capsule Take 200 mg by mouth daily.    . CRESTOR 40 MG tablet TAKE ONE TABLET BY MOUTH ONCE DAILY 90 tablet 2  . ezetimibe (ZETIA) 10 MG tablet Take 1 tablet (10 mg total) by mouth daily. 30 tablet 11  . isosorbide mononitrate (IMDUR) 60 MG 24 hr tablet TAKE ONE AND ONE-HALF TABLETS BY MOUTH ONCE DAILY 45 tablet 11  . lisinopril-hydrochlorothiazide (PRINZIDE,ZESTORETIC) 20-12.5 MG tablet TAKE ONE TABLET BY MOUTH ONCE DAILY 90 tablet 2  . magnesium oxide (MAG-OX) 400 MG tablet Take 400 mg by mouth daily.    . metoprolol (LOPRESSOR)  50 MG tablet TAKE ONE TABLET BY MOUTH TWICE DAILY 60 tablet 11  . nitroGLYCERIN (NITROSTAT) 0.4 MG SL tablet Place 0.4 mg under the tongue every 5 (five) minutes as needed for chest pain (MAX 3 DOSES).    . pantoprazole (PROTONIX) 40 MG tablet Take 40 mg by mouth daily.    . Potassium Gluconate 595 MG CAPS Take 595 mg by mouth daily.    [name] terbinafine (LAMISIL) 250 MG tablet Take 250 mg by mouth daily.    [name] aspirin 325 MG Take 1 tablet (325 mg total) by mouth daily.     No current facility-administered medications for this visit.     Allergies:   Acetaminophen and Niacin   Social History   Social History  . Marital Status: Married    Spouse Name: N/A  . Number of Children: N/A  . Years of  Education: N/A   Social History Main Topics  . Smoking status: Former Smoker -- 4.00 packs/day for 43 years    Types: Cigarettes    Quit date: 01/24/2002  . Smokeless tobacco: Never Used  . Alcohol Use: 8.4 oz/week    14 Glasses of wine per week  . Drug Use: No  . Sexual Activity: Not Currently   Other Topics Concern  . None   Social History Narrative     Family History:  The patient's family history includes Heart attack in his brother, father, and paternal uncle; Hypertension in his other. There is no history of Stroke.   ROS:   Please see the history of present illness.  All other systems are reviewed and otherwise negative.    PHYSICAL EXAM:   VS:  BP 109/58 mmHg  Pulse 72  Ht 5\' 8"  (1.727 m)  Wt 196 lb 1.9 oz (88.959 kg)  BMI 29.83 kg/m2  SpO2   BMI: Body mass index is 29.83 kg/(m^2). GEN: Well nourished, well developed WM, in no acute distress HEENT: normocephalic, atraumatic Neck: no JVD, carotid bruits, or masses Cardiac: RRR; no murmurs, rubs, or gallops, no edema  Respiratory:  clear to auscultation bilaterally, normal work of breathing GI: soft, nontender, nondistended, + BS MS: no deformity or atrophy Skin: warm and dry, no rash Neuro:  Alert and Oriented x 3, Strength and sensation are intact, follows commands Psych: euthymic mood, full affect  Wt Readings from Last 3 Encounters:  05/16/16 196 lb 1.9 oz (88.959 kg)  04/21/16 201 lb 11.2 oz (91.491 kg)  06/04/15 209 lb 12 oz (95.142 kg)      Studies/Labs Reviewed:   EKG:   EKG was not ordered today.  Recent Labs: 04/20/2016: BUN 20; Creatinine, Ser 1.34*; Hemoglobin 13.0; Platelets 204; Potassium 3.9; Sodium 136   Lipid Panel    Component Value Date/Time   CHOL 131 11/22/2013 1332   TRIG 94.0 11/22/2013 1332   HDL 50.40 11/22/2013 1332   CHOLHDL 3 11/22/2013 1332   VLDL 18.8 11/22/2013 1332   LDLCALC 62 11/22/2013 1332    Additional studies/ records that were reviewed today  include: Summarized above.    ASSESSMENT & PLAN:   1. CAD as above with recent CP - nuclear stress test was nonischemic. CP has resolved. Continue to monitor. Warning sx reviewed. Decrease ASA to 81mg  daily since he is on Plavix. 2. Essential HTN - controlled. We reviewed importance of remaining hydrated in the heat on the golf course. 3. Hyperlipidemia - continue statin and Zetia. He has labs planned at PCP in July and will bring  them in when he sees Dr. Marlou Porch for planned follow-up. 4. CKD stage III (suspected) - he does not know what his f/u Cr was but states he was told it was better at PCP's recently. We discussed avoidance of NSAIDs.  Disposition: Patient wants to keep f/u with Dr. Marlou Porch as scheduled in July.   Medication Adjustments/Labs and Tests Ordered: Current medicines are reviewed at length with the patient today.  Concerns regarding medicines are outlined above. Medication changes, Labs and Tests ordered today are summarized above and listed in the Patient Instructions accessible in Encounters.   Raechel Ache PA-C  05/16/2016 2:40 PM    [facility] [place], [place], [place]  [address] Phone: [phone]; Fax: [phone]

## 2016-05-19 ENCOUNTER — Other Ambulatory Visit: Payer: Self-pay | Admitting: Cardiology

## 2016-06-07 DIAGNOSIS — E782 Mixed hyperlipidemia: Secondary | ICD-10-CM | POA: Diagnosis not present

## 2016-06-07 DIAGNOSIS — Z0000 Encounter for general adult medical examination without abnormal findings: Secondary | ICD-10-CM | POA: Diagnosis not present

## 2016-06-07 DIAGNOSIS — R350 Frequency of micturition: Secondary | ICD-10-CM | POA: Diagnosis not present

## 2016-06-07 DIAGNOSIS — I10 Essential (primary) hypertension: Secondary | ICD-10-CM | POA: Diagnosis not present

## 2016-06-07 DIAGNOSIS — K219 Gastro-esophageal reflux disease without esophagitis: Secondary | ICD-10-CM | POA: Diagnosis not present

## 2016-06-10 ENCOUNTER — Other Ambulatory Visit: Payer: Self-pay | Admitting: Cardiology

## 2016-06-16 ENCOUNTER — Ambulatory Visit (INDEPENDENT_AMBULATORY_CARE_PROVIDER_SITE_OTHER): Payer: Medicare HMO | Admitting: Cardiology

## 2016-06-16 ENCOUNTER — Encounter: Payer: Self-pay | Admitting: Cardiology

## 2016-06-16 VITALS — BP 120/70 | HR 58 | Ht 68.0 in | Wt 197.0 lb

## 2016-06-16 DIAGNOSIS — I10 Essential (primary) hypertension: Secondary | ICD-10-CM

## 2016-06-16 DIAGNOSIS — I2111 ST elevation (STEMI) myocardial infarction involving right coronary artery: Secondary | ICD-10-CM

## 2016-06-16 DIAGNOSIS — I2510 Atherosclerotic heart disease of native coronary artery without angina pectoris: Secondary | ICD-10-CM

## 2016-06-16 DIAGNOSIS — N183 Chronic kidney disease, stage 3 unspecified: Secondary | ICD-10-CM

## 2016-06-16 DIAGNOSIS — I240 Acute coronary thrombosis not resulting in myocardial infarction: Secondary | ICD-10-CM

## 2016-06-16 DIAGNOSIS — E785 Hyperlipidemia, unspecified: Secondary | ICD-10-CM

## 2016-06-16 NOTE — Progress Notes (Signed)
[place]. [address], [place]  [address] Phone: [phone] Fax:  [phone]  Date:  06/16/2016   ID:  Thomas Farrell, DOB May 22, 1941, MRN [id number]  PCP:   Melinda Crutch, MD   History of Present Illness: Thomas Farrell is a 75 y.o. male with coronary artery disease who had successful stent to right coronary artery x2,  (2009 - s/p PTCA/DES to RCA with wire-induced dissection in distal RCA, 2013 - s/p DES to [facility] and cutting balloon angioplasty to ISR to [facility]), highly calcified vessel here for followup. Last PCI was performed in March of 2013. DES placement to distal right coronary artery with 3.0 x 20 mm Promus. First PCI was performed in 2009, also has occluded circumflex with left to left collaterals no change.[name] His ALT has been mildly elevated in the past but upon recent check it was normal. Had mild cardiomyopathy with EF 45-50% in 2009 (50-55% by echo 04/2016), suspected CKD stage III (Cr 1.3-1.4 in 04/2016), HTN, HLD. GERD.  He was admitted 04/2016 with CP and ruled out for MI. He was noted to have 12 beats of WCT. K 3.9. Nuc 04/21/16 showed prior infarct but no ischemia, LVEF 39%. 2D echo was obtained as outpatient 05/11/16 with mild focal basal hypertrophy of the septum, EF 50-55% with mild HK of the inferolateral and inferior myocardium, mod LAE.  No further CP. No SOB. He has been back out on the golf course without difficulty. Initial BP by the tech was 90/60 but this was with large cuff - recheck by me with regular appropriate cuff was 109/58. He states it usually runs 115/70s at home. He denies any dizziness or syncope. He reports that he f/u with his PCP already and was told that his kidney function was better  He takes care of his wife who is bedridden from prior stroke in 2013, March when he had his last stent placement. He has significant help at home as well. His wife's name is Thomas Farrell. New bathtub, $6000. Still enjoying golf. [employer]. Excellent  golfer.  Talked about his hyperlipidemia.  Elevated ALT in the past. Currently ALT is 48. Excellent. LDL is 62.    Wt Readings from Last 3 Encounters:  06/16/16 197 lb (89.4 kg)  05/16/16 196 lb 1.9 oz (89 kg)  04/21/16 201 lb 11.2 oz (91.5 kg)     Past Medical History:  Diagnosis Date  . CAD in native artery    a. 2009 - s/p PTCA/DES to RCA with wire-induced dissection in distal RCA. b. 2013 - s/p DES to dRCA and cutting balloon angioplasty to ISR to mRCA  . Cancer of skin of temple    "right"  . Cardiomyopathy ([place])    a. EF 45-50% in 2009. b. 50-55% by echo 04/2016.  [name] CKD (chronic kidney disease), stage III   . GERD (gastroesophageal reflux disease)   . Hyperlipidemia   . Hypertension   . Myocardial infarction ([place]) 2009  . Obesity   . Osteoarthritis     Past Surgical History:  Procedure Laterality Date  . CARDIAC CATHETERIZATION  2009;  12/2011  . CORONARY ANGIOPLASTY WITH STENT PLACEMENT  2009; 01/2012   "~ 2wk after each cath"  . INGUINAL HERNIA REPAIR Right ~ 2010  . PERCUTANEOUS CORONARY STENT INTERVENTION (PCI-S) N/A 01/25/2012   Procedure: PERCUTANEOUS CORONARY STENT INTERVENTION (PCI-S);  Surgeon: Jettie Booze, MD;  Location: [facility] CATH LAB;  Service: Cardiovascular;  Laterality: N/A;  .  SKIN CANCER EXCISION Right    temple  . TONSILLECTOMY      Current Outpatient Prescriptions  Medication Sig Dispense Refill  . aspirin EC 81 MG tablet Take 1 tablet (81 mg total) by mouth daily. 90 tablet 3  . clopidogrel (PLAVIX) 75 MG tablet TAKE ONE TABLET BY MOUTH ONCE DAILY 90 tablet 3  . co-enzyme Q-10 30 MG capsule Take 200 mg by mouth daily.    . CRESTOR 40 MG tablet TAKE ONE TABLET BY MOUTH ONCE DAILY 90 tablet 2  . ezetimibe (ZETIA) 10 MG tablet Take 1 tablet (10 mg total) by mouth daily. 30 tablet 11  . isosorbide mononitrate (IMDUR) 60 MG 24 hr tablet TAKE ONE AND ONE-HALF TABLETS BY MOUTH ONCE DAILY 45 tablet 11  . lisinopril-hydrochlorothiazide  (PRINZIDE,ZESTORETIC) 20-12.5 MG tablet TAKE ONE TABLET BY MOUTH ONCE DAILY 90 tablet 2  . magnesium oxide (MAG-OX) 400 MG tablet Take 400 mg by mouth daily.    . metoprolol (LOPRESSOR) 50 MG tablet TAKE ONE TABLET BY MOUTH TWICE DAILY 60 tablet 11  . nitroGLYCERIN (NITROSTAT) 0.4 MG SL tablet Place 0.4 mg under the tongue every 5 (five) minutes as needed for chest pain (MAX 3 DOSES).    . pantoprazole (PROTONIX) 40 MG tablet Take 40 mg by mouth daily.    . Potassium Gluconate 595 MG CAPS Take 595 mg by mouth daily.    [name] terbinafine (LAMISIL) 250 MG tablet Take 250 mg by mouth daily.     No current facility-administered medications for this visit.     Allergies:    Allergies  Allergen Reactions  . Acetaminophen Diarrhea and Nausea And Vomiting  . Niacin Other (See Comments)    flushing    Social History:  The patient  reports that he quit smoking about 14 years ago. His smoking use included Cigarettes. He has a 172.00 pack-year smoking history. He has never used smokeless tobacco. He reports that he drinks about 8.4 oz of alcohol per week . He reports that he does not use drugs.   ROS:  Please see the history of present illness.   Denies any bleeding, syncope, orthopnea, PND    PHYSICAL EXAM: VS:  BP 120/70   Pulse (!) 58   Ht 5\' 8"  (1.727 m)   Wt 197 lb (89.4 kg)   BMI 29.95 kg/m  Well nourished, well developed, in no acute distress  HEENT: normal  Neck: no JVD  Cardiac:  normal S1, S2; RRR; no murmur  Lungs:  clear to auscultation bilaterally, no wheezing, rhonchi or rales  Abd: soft, nontender, no hepatomegaly  Ext: no edema  Skin: warm and dry  Neuro: no focal abnormalities noted  EKG:  Today, sinus bradycardia rate 53 with old inferior posterior infarct pattern personally viewed with mild peaking of T waves in V2, V3, no significant change from prior-06/04/14: Sinus bradycardia rate 51, mild peaking of T waves in V2, V3. No significant ST changes, J-point elevation noted  in V3, V4, V5.  Labs: 05/27/16-LDL 67, HDL 55, creatinine 0.91 2017- ALT 37  ASSESSMENT AND PLAN:  1. Old myocardial infarction-currently doing well, no anginal symptoms. Excellent. 2. Coronary artery disease with angina-occluded circumflex as above. Previously placed RCA stents. Continue with both Plavix and aspirin 81 mg. no signs of bleeding, nuclear stress test 04/2016 was nonischemic. 3. Elevated LFTs-currently very well with ALT of 43, 38 on repeat. Currently 48 4. Hyperlipidemia -- LDL excellent at 65. Secondary prevention. Crestor, Zetia. Doing well.  No changes made. 5. Hypertension-currently doing well. No changes made. 6. 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 On lisinopril. 7. 1 year f/u  Signed, Candee Furbish, MD [facility]  06/16/2016 9:55 AM

## 2016-06-16 NOTE — Patient Instructions (Signed)

## 2016-06-21 ENCOUNTER — Other Ambulatory Visit: Payer: Self-pay | Admitting: Cardiology

## 2016-06-22 NOTE — Telephone Encounter (Signed)
Medication  rosuvastatin (CRESTOR) tablet 40 mg [[id number]]  rosuvastatin (CRESTOR) tablet 40 mg  [id number]   Ordered Dose: 40 mg Route: Oral Frequency: Daily  Administration Dose: 40 mg     Start: 04/21/16 1000 End: 04/21/16 2133 First Dose: As Scheduled    Order Status: Discontinued  Ordering User: Rise Patience, MD Ordering Date/Time: Wed Apr 20, 2016 2259  Ordering Provider: Rise Patience, MD Authorizing Provider: Rise Patience, MD  D/C User: Automatic Discharge Provider D/C Date/Time: Thu Apr 21, 2016 2133  D/C Reason: Patient Discharge D/C Verified By: Not Verified  Most Recent Dispense Information   Action User: Jetty Peeks, RPH Action Type: Verify   Dispense Pharmacy: [facility] 3W-A [facility] First Doses Dispense Pharmacy: [facility] 3W-A [facility]  Dispense Code: Unit Dose Cart Group: Unit Dose Dispense Interval: --  Triggered Fill: No Dispense Once: No Do Not Dispense: No  Patient Supplied Medication: No Self Administered: No   Dispense Individual Ingredients: No    Patient Class: Observation      Review Actions   Date/Time in Queue for Review Date/Time Reviewed Review Context Reviewed By Review Action  Wed Apr 20, 2016 2259 Wed Apr 20, 2016 [place], [facility] Verified by Pharmacy  Pharmacy Actions   Date/Time Type User Pharmacy  Thu Apr 21, 2016 [place], [place] Dispense [facility] 3W-A [facility]  Wed Apr 20, 2016 2309 Verify Antoine Primas Abbott, [facility] 3W-A [facility]     Order Class   Normal  All Administrations of rosuvastatin (CRESTOR) tablet 40 mg   Administration Action Time Recorded Time Documented By Site Comment Reason Patient Supplied  Duplicate : 0 mg : 0 : Oral 04/21/16 1000 04/21/16 0739 Murvin Natal, RN    No  Given : 40 mg :  : Oral 04/21/16 0736 04/21/16 0737 Murvin Natal, RN  given early per pt request  No      Med Administrations and Associated Flowsheet Values (last 96 hours)   None

## 2016-06-25 ENCOUNTER — Other Ambulatory Visit: Payer: Self-pay | Admitting: Cardiology

## 2016-07-14 ENCOUNTER — Other Ambulatory Visit: Payer: Self-pay | Admitting: Cardiology

## 2016-07-22 DIAGNOSIS — Z23 Encounter for immunization: Secondary | ICD-10-CM | POA: Diagnosis not present

## 2016-08-31 DIAGNOSIS — R69 Illness, unspecified: Secondary | ICD-10-CM | POA: Diagnosis not present

## 2016-10-05 DIAGNOSIS — L814 Other melanin hyperpigmentation: Secondary | ICD-10-CM | POA: Diagnosis not present

## 2016-10-05 DIAGNOSIS — L570 Actinic keratosis: Secondary | ICD-10-CM | POA: Diagnosis not present

## 2016-10-05 DIAGNOSIS — D1801 Hemangioma of skin and subcutaneous tissue: Secondary | ICD-10-CM | POA: Diagnosis not present

## 2016-10-05 DIAGNOSIS — Z85828 Personal history of other malignant neoplasm of skin: Secondary | ICD-10-CM | POA: Diagnosis not present

## 2016-10-05 DIAGNOSIS — D485 Neoplasm of uncertain behavior of skin: Secondary | ICD-10-CM | POA: Diagnosis not present

## 2016-10-05 DIAGNOSIS — L821 Other seborrheic keratosis: Secondary | ICD-10-CM | POA: Diagnosis not present

## 2016-10-10 ENCOUNTER — Encounter: Payer: Self-pay | Admitting: *Deleted

## 2016-10-19 ENCOUNTER — Encounter: Payer: Self-pay | Admitting: Cardiology

## 2016-10-19 ENCOUNTER — Ambulatory Visit (INDEPENDENT_AMBULATORY_CARE_PROVIDER_SITE_OTHER): Payer: Medicare HMO | Admitting: Cardiology

## 2016-10-19 VITALS — BP 104/62 | HR 73 | Ht 68.0 in | Wt 202.2 lb

## 2016-10-19 DIAGNOSIS — I2111 ST elevation (STEMI) myocardial infarction involving right coronary artery: Secondary | ICD-10-CM | POA: Diagnosis not present

## 2016-10-19 DIAGNOSIS — N183 Chronic kidney disease, stage 3 unspecified: Secondary | ICD-10-CM

## 2016-10-19 DIAGNOSIS — I10 Essential (primary) hypertension: Secondary | ICD-10-CM | POA: Diagnosis not present

## 2016-10-19 DIAGNOSIS — I2510 Atherosclerotic heart disease of native coronary artery without angina pectoris: Secondary | ICD-10-CM

## 2016-10-19 DIAGNOSIS — I240 Acute coronary thrombosis not resulting in myocardial infarction: Secondary | ICD-10-CM

## 2016-10-19 DIAGNOSIS — E7800 Pure hypercholesterolemia, unspecified: Secondary | ICD-10-CM

## 2016-10-19 NOTE — Progress Notes (Signed)
[place]. [address], [address], [place]  [address] Phone: [phone] Fax:  [phone]  Date:  10/19/2016   ID:  Thomas Farrell, DOB 23-Apr-1941, MRN [id number]  PCP:  Melinda Crutch, MD   History of Present Illness: Thomas Farrell is a 75 y.o. male with coronary artery disease who had successful stent to right coronary artery x2,  (2009 - s/p PTCA/DES to RCA with wire-induced dissection in distal RCA, 2013 - s/p DES to [facility] and cutting balloon angioplasty to ISR to [facility]), highly calcified vessel here for followup. Last PCI was performed in March of 2013. DES placement to distal right coronary artery with 3.0 x 20 mm Promus. First PCI was performed in 2009, also has occluded circumflex with left to left collaterals no change.[name] His ALT has been mildly elevated in the past but upon recent check it was normal. Had mild cardiomyopathy with EF 45-50% in 2009 (50-55% by echo 04/2016), suspected CKD stage III (Cr 1.3-1.4 in 04/2016), HTN, HLD. GERD.  He was admitted 04/2016 with CP and ruled out for MI. He was noted to have 12 beats of WCT. K 3.9. Nuc 04/21/16 showed prior infarct but no ischemia, LVEF 39%. 2D echo was obtained as outpatient 05/11/16 with mild focal basal hypertrophy of the septum, EF 50-55% with mild HK of the inferolateral and inferior myocardium, mod LAE.  No further CP. No SOB. He has been back out on the golf course without difficulty. Initial BP by the tech was 90/60 but this was with large cuff - recheck by me with regular appropriate cuff was 109/58. He states it usually runs 115/70s at home. He denies any dizziness or syncope. He reports that he f/u with his PCP already and was told that his kidney function was better  He takes care of his wife who is bedridden from prior stroke in 2013, March when he had his last stent placement. He has significant help at home as well. His wife's name is Thomas Farrell. New bathtub, $6000. She is now seeing Dr. Reymundo Poll, housecalls. Still  enjoying golf. [employer]. Excellent golfer.  Talked about his hyperlipidemia. overall doing well with medications. He is not having any bleeding episodes with dual antiplatelet therapy which we are continuing.   Elevated ALT in the past. Currently ALT is 48. Excellent. LDL is 62.    Wt Readings from Last 3 Encounters:  10/19/16 202 lb 3.2 oz (91.7 kg)  06/16/16 197 lb (89.4 kg)  05/16/16 196 lb 1.9 oz (89 kg)     Past Medical History:  Diagnosis Date  . CAD in native artery    a. 2009 - s/p PTCA/DES to RCA with wire-induced dissection in distal RCA. b. 2013 - s/p DES to dRCA and cutting balloon angioplasty to ISR to mRCA  . Cancer of skin of temple    "right"  . Cardiomyopathy ([place])    a. EF 45-50% in 2009. b. 50-55% by echo 04/2016.  [name] CKD (chronic kidney disease), stage III   . GERD (gastroesophageal reflux disease)   . Hyperlipidemia   . Hypertension   . Myocardial infarction 2009  . Obesity   . Osteoarthritis     Past Surgical History:  Procedure Laterality Date  . CARDIAC CATHETERIZATION  2009;  12/2011  . CORONARY ANGIOPLASTY WITH STENT PLACEMENT  2009; 01/2012   "~ 2wk after each cath"  . INGUINAL HERNIA REPAIR Right ~ 2010  . PERCUTANEOUS CORONARY STENT INTERVENTION (PCI-S)  N/A 01/25/2012   Procedure: PERCUTANEOUS CORONARY STENT INTERVENTION (PCI-S);  Surgeon: Jettie Booze, MD;  Location: [facility] CATH LAB;  Service: Cardiovascular;  Laterality: N/A;  . SKIN CANCER EXCISION Right    temple  . TONSILLECTOMY      Current Outpatient Prescriptions  Medication Sig Dispense Refill  . aspirin EC 81 MG tablet Take 1 tablet (81 mg total) by mouth daily. 90 tablet 3  . clopidogrel (PLAVIX) 75 MG tablet TAKE ONE TABLET BY MOUTH ONCE DAILY 90 tablet 3  . co-enzyme Q-10 30 MG capsule Take 200 mg by mouth daily.    [name] ezetimibe (ZETIA) 10 MG tablet Take 1 tablet (10 mg total) by mouth daily. 30 tablet 11  . isosorbide mononitrate (IMDUR) 60 MG 24 hr tablet TAKE ONE AND  ONE-HALF TABLETS BY MOUTH ONCE DAILY 45 tablet 11  . lisinopril-hydrochlorothiazide (PRINZIDE,ZESTORETIC) 20-12.5 MG tablet TAKE ONE TABLET BY MOUTH ONCE DAILY 90 tablet 3  . metoprolol (LOPRESSOR) 50 MG tablet TAKE ONE TABLET BY MOUTH TWICE DAILY 60 tablet 11  . nitroGLYCERIN (NITROSTAT) 0.4 MG SL tablet Place 0.4 mg under the tongue every 5 (five) minutes as needed for chest pain (MAX 3 DOSES).    . pantoprazole (PROTONIX) 40 MG tablet Take 40 mg by mouth daily.    . Potassium Gluconate 595 MG CAPS Take 595 mg by mouth daily.    . rosuvastatin (CRESTOR) 40 MG tablet TAKE ONE TABLET BY MOUTH ONCE DAILY 90 tablet 3  . terbinafine (LAMISIL) 250 MG tablet Take 250 mg by mouth daily.     No current facility-administered medications for this visit.     Allergies:    Allergies  Allergen Reactions  . Acetaminophen Diarrhea and Nausea And Vomiting  . Niacin Other (See Comments)    flushing    Social History:  The patient  reports that he quit smoking about 14 years ago. His smoking use included Cigarettes. He has a 172.00 pack-year smoking history. He has never used smokeless tobacco. He reports that he drinks about 8.4 oz of alcohol per week . He reports that he does not use drugs.   ROS:  Please see the history of present illness.   Denies any bleeding, syncope, orthopnea, PND    PHYSICAL EXAM: VS:  BP 104/62   Pulse 73   Ht 5\' 8"  (1.727 m)   Wt 202 lb 3.2 oz (91.7 kg)   BMI 30.74 kg/m  Well nourished, well developed, in no acute distress  HEENT: normal  Neck: no JVD  Cardiac:  normal S1, S2; RRR; no murmur  Lungs:  clear to auscultation bilaterally, no wheezing, rhonchi or rales  Abd: soft, nontender, no hepatomegaly  Ext: no edema  Skin: warm and dry  Neuro: no focal abnormalities noted  EKG:  Today, sinus bradycardia rate 53 with old inferior posterior infarct pattern personally viewed with mild peaking of T waves in V2, V3, no significant change from prior-06/04/14: Sinus  bradycardia rate 51, mild peaking of T waves in V2, V3. No significant ST changes, J-point elevation noted in V3, V4, V5.  Labs: 05/27/16-LDL 67, HDL 55, creatinine 0.91 2017- ALT 37  ASSESSMENT AND PLAN:  1. Old myocardial infarction-currently doing well, no anginal symptoms. Excellent. 2. Coronary artery disease with angina-occluded circumflex as above. Previously placed RCA stents. Continue with both Plavix and aspirin 81 mg. no signs of bleeding, nuclear stress test 04/2016 was nonischemic. 3. Elevated LFTs-currently very well with ALT of 43, 38 on repeat.  Currently 48 4. Hyperlipidemia -- LDL excellent at 65. Secondary prevention. Crestor, Zetia. Doing well. No changes made. 5. Hypertension-currently doing well. No changes made. 6. 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 On lisinopril. 7. Leg pain-I checked his pulses, however excellent bilaterally DP. No evidence on physical exam of peripheral vascular disease. He did have a friend that had this. Likely neuropathic pain when laying down at night, taking leg cramp medication. 8. 1 year f/u  Signed, Candee Furbish, MD [facility]  10/19/2016 9:48 AM

## 2016-10-19 NOTE — Patient Instructions (Signed)

## 2016-12-13 ENCOUNTER — Other Ambulatory Visit: Payer: Self-pay | Admitting: Cardiology

## 2017-02-20 ENCOUNTER — Telehealth: Payer: Self-pay | Admitting: Cardiology

## 2017-02-20 NOTE — Telephone Encounter (Signed)
Spoke with pt who is reporting an increase in SOB with activity.  This does not occur all the time but has been increasing other the last few weeks  Like today he was able to play golf without any issues but has been having increased SOB walking up the 13 steps from the basement.  He denies any CP, no edema or swelling, no cough and his weights have been stable.  He reports the last time he felt like this he ended up with a stent.  As he is asymptomatic at this time advised I will review with Dr Marlou Porch and call him back.  He is aware to report to ED if s/s worsen or change.

## 2017-02-20 NOTE — Telephone Encounter (Signed)
Pt c/o Shortness Of Breath: STAT if SOB developed within the last 24 hours or pt is noticeably SOB on the phone  1. Are you currently SOB (can you hear that pt is SOB on the phone)? no 2. How long have you been experiencing SOB? week  3. Are you SOB when sitting or when up moving around? Up and moving  4. Are you currently experiencing any other symptoms? no

## 2017-02-21 NOTE — Telephone Encounter (Signed)
Let get him in for office visit. (me or APP ok) Thanks

## 2017-02-22 NOTE — Telephone Encounter (Signed)
error 

## 2017-02-22 NOTE — Telephone Encounter (Signed)
Attempted to contact pt with an appt. - NA no VM  Holding 4/9 at 11:40 for him.

## 2017-02-22 NOTE — Telephone Encounter (Signed)
Spoke with pt who denies any symptoms at this time.  Pt is aware of appt dcheduled on 4/9 at 11:40.

## 2017-02-24 ENCOUNTER — Encounter: Payer: Self-pay | Admitting: Cardiology

## 2017-02-27 ENCOUNTER — Encounter: Payer: Self-pay | Admitting: Cardiology

## 2017-02-27 ENCOUNTER — Encounter (INDEPENDENT_AMBULATORY_CARE_PROVIDER_SITE_OTHER): Payer: Self-pay

## 2017-02-27 ENCOUNTER — Ambulatory Visit (INDEPENDENT_AMBULATORY_CARE_PROVIDER_SITE_OTHER): Payer: Medicare HMO | Admitting: Cardiology

## 2017-02-27 VITALS — BP 132/84 | HR 50 | Ht 68.0 in | Wt 216.4 lb

## 2017-02-27 DIAGNOSIS — I2111 ST elevation (STEMI) myocardial infarction involving right coronary artery: Secondary | ICD-10-CM | POA: Diagnosis not present

## 2017-02-27 DIAGNOSIS — R0602 Shortness of breath: Secondary | ICD-10-CM | POA: Diagnosis not present

## 2017-02-27 DIAGNOSIS — I2510 Atherosclerotic heart disease of native coronary artery without angina pectoris: Secondary | ICD-10-CM | POA: Diagnosis not present

## 2017-02-27 DIAGNOSIS — I240 Acute coronary thrombosis not resulting in myocardial infarction: Secondary | ICD-10-CM

## 2017-02-27 NOTE — Progress Notes (Signed)
[facility]. [address] [place], [place]  [address] Phone: [phone] Fax:  [phone]  Date:  02/27/2017   ID:  Thomas Farrell, DOB [birth year], MRN [id number]  PCP:  Melinda Crutch, MD   History of Present Illness: Thomas Farrell is a 76 y.o. male with coronary artery disease who had successful stent to right coronary artery x2,  (2009 - s/p PTCA/DES to RCA with wire-induced dissection in distal RCA, 2013 - s/p DES to [facility] and cutting balloon angioplasty to ISR to [facility]), highly calcified vessel here for followup.   Last PCI was performed in March of 2013. DES placement to distal right coronary artery with 3.0 x 20 mm Promus. First PCI was performed in 2009, also has occluded circumflex with left to left collaterals no change.[name] His ALT has been mildly elevated in the past but upon recent check it was normal. Had mild cardiomyopathy with EF 45-50% in 2009 (50-55% by echo 04/2016), suspected CKD stage III (Cr 1.3-1.4 in 04/2016), HTN, HLD. GERD.  He was admitted 04/2016 with CP and ruled out for MI. He was noted to have 12 beats of WCT. K 3.9. Nuc 04/21/16 showed prior infarct but no ischemia, LVEF 39%. 2D echo was obtained as outpatient 05/11/16 with mild focal basal hypertrophy of the septum, EF 50-55% with mild HK of the inferolateral and inferior myocardium, mod LAE.  He has been back out on the golf course without difficulty. Initial BP by the tech was 90/60 but this was with large cuff - recheck by me with regular appropriate cuff was 109/58. He states it usually runs 115/70s at home. He denies any dizziness or syncope. He reports that he f/u with his PCP already and was told that his kidney function was better  He takes care of his wife who is bedridden from prior stroke in 2013, March when he had his last stent placement. He has significant help at home as well. His wife's name is Thomas Farrell. New bathtub, $6000. She is now seeing Dr. Reymundo Poll, housecalls. Still enjoying golf. [employer]. Excellent golfer.  Talked about his hyperlipidemia. overall doing well with medications. He is not having any bleeding episodes with dual antiplatelet therapy which we are continuing.   Elevated ALT in the past. Currently ALT is 48. Excellent. LDL is 62.  02/27/17-he has noted some increased shortness of breath with this activity over the past several weeks. He walks up 13 steps from his basement and notices this change. No chest pain. The last time he felt like this and that up with distal RCA stent.  He feels better since he called. He will watch this closely. He has gained about 20 pounds. He will continue to try to lose weight.   Wt Readings from Last 3 Encounters:  02/27/17 216 lb 6.4 oz (98.2 kg)  10/19/16 202 lb 3.2 oz (91.7 kg)  06/16/16 197 lb (89.4 kg)     Past Medical History:  Diagnosis Date  . CAD in native artery    a. 2009 - s/p PTCA/DES to RCA with wire-induced dissection in distal RCA. b. 2013 - s/p DES to dRCA and cutting balloon angioplasty to ISR to mRCA  . Cancer of skin of temple    "right"  . Cardiomyopathy ([place])    a. EF 45-50% in 2009. b. 50-55% by echo 04/2016.  [name] CKD (chronic kidney disease), stage III   . GERD (gastroesophageal reflux disease)   . Hyperlipidemia   .  Hypertension   . Myocardial infarction 2009  . Obesity   . Osteoarthritis     Past Surgical History:  Procedure Laterality Date  . CARDIAC CATHETERIZATION  2009;  12/2011  . CORONARY ANGIOPLASTY WITH STENT PLACEMENT  2009; 01/2012   "~ 2wk after each cath"  . INGUINAL HERNIA REPAIR Right ~ 2010  . PERCUTANEOUS CORONARY STENT INTERVENTION (PCI-S) N/A 01/25/2012   Procedure: PERCUTANEOUS CORONARY STENT INTERVENTION (PCI-S);  Surgeon: Jettie Booze, MD;  Location: [facility] CATH LAB;  Service: Cardiovascular;  Laterality: N/A;  . SKIN CANCER EXCISION Right    temple  . TONSILLECTOMY      Current Outpatient Prescriptions  Medication Sig Dispense Refill  . aspirin EC 81 MG tablet Take  1 tablet (81 mg total) by mouth daily. 90 tablet 3  . clopidogrel (PLAVIX) 75 MG tablet TAKE ONE TABLET BY MOUTH ONCE DAILY 90 tablet 3  . co-enzyme Q-10 30 MG capsule Take 200 mg by mouth daily.    [name] ezetimibe (ZETIA) 10 MG tablet TAKE ONE TABLET BY MOUTH ONCE DAILY 90 tablet 2  . isosorbide mononitrate (IMDUR) 60 MG 24 hr tablet TAKE ONE AND ONE-HALF TABLETS BY MOUTH ONCE DAILY 45 tablet 11  . lisinopril-hydrochlorothiazide (PRINZIDE,ZESTORETIC) 20-12.5 MG tablet TAKE ONE TABLET BY MOUTH ONCE DAILY 90 tablet 3  . metoprolol (LOPRESSOR) 50 MG tablet TAKE ONE TABLET BY MOUTH TWICE DAILY 60 tablet 11  . nitroGLYCERIN (NITROSTAT) 0.4 MG SL tablet Place 0.4 mg under the tongue every 5 (five) minutes as needed for chest pain (MAX 3 DOSES).    . pantoprazole (PROTONIX) 40 MG tablet Take 40 mg by mouth daily.    . rosuvastatin (CRESTOR) 40 MG tablet TAKE ONE TABLET BY MOUTH ONCE DAILY 90 tablet 3  . tamsulosin (FLOMAX) 0.4 MG CAPS capsule Take 0.4 mg by mouth daily.    [name] terbinafine (LAMISIL) 250 MG tablet Take 250 mg by mouth daily.     No current facility-administered medications for this visit.     Allergies:    Allergies  Allergen Reactions  . Acetaminophen Diarrhea and Nausea And Vomiting  . Niacin Other (See Comments)    flushing    Social History:  The patient  reports that he quit smoking about 15 years ago. His smoking use included Cigarettes. He has a 172.00 pack-year smoking history. He has never used smokeless tobacco. He reports that he drinks about 8.4 oz of alcohol per week . He reports that he does not use drugs.   ROS:  Please see the history of present illness.   Denies any bleeding, syncope, orthopnea, PND    PHYSICAL EXAM: VS:  BP 132/84 (BP Location: Left Arm, Patient Position: Sitting, Cuff Size: Normal)   Pulse (!) 50   Ht 5\' 8"  (1.727 m)   Wt 216 lb 6.4 oz (98.2 kg)   SpO2 98%   BMI 32.90 kg/m  GEN: Well nourished, well developed, in no acute distress  HEENT:  normal  Neck: no JVD, carotid bruits, or masses Cardiac: RRR; no murmurs, rubs, or gallops,no edema  Respiratory:  clear to auscultation bilaterally, normal work of breathing GI: soft, nontender, nondistended, + BS, obese MS: no deformity or atrophy  Skin: warm and dry, no rash Neuro:  Alert and Oriented x 3, Strength and sensation are intact Psych: euthymic mood, full affect   EKG:  Today, 02/27/17-sinus bradycardia rate 50 with no other abnormalities personally viewed-prior sinus bradycardia rate 53 with old inferior posterior infarct pattern  personally viewed with mild peaking of T waves in V2, V3, no significant change from prior-06/04/14: Sinus bradycardia rate 51, mild peaking of T waves in V2, V3. No significant ST changes, J-point elevation noted in V3, V4, V5.  Labs: 05/27/16-LDL 67, HDL 55, creatinine 0.91 2017- ALT 37  ASSESSMENT AND PLAN:  1. Old myocardial infarction-continuing to monitor. Angina may be shortness of breath. If symptoms worsen of shortness of breath, we will likely pursue cardiac catheterization. 2. Coronary artery disease with angina-occluded circumflex as above. Previously placed RCA stents. Continue with both Plavix and aspirin 81 mg. no signs of bleeding, nuclear stress test 04/2016 was nonischemic. 3. Elevated LFTs-currently very well with ALT of 43, 38 on repeat. Overall doing well. 4. Hyperlipidemia -- LDL excellent at 65. Secondary prevention. Crestor, Zetia. Doing well.  No myalgias. No changes 5. Hypertension-currently doing well. No changes made. 6. History of heavy tobacco use-some of the shortness of breath may be related to his long-standing history of smoking. I asked him to discuss this with Dr. Harrington Challenger who may wish to pursue spirometry at some point. 7. 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 On lisinopril. Good for renal protection. No changes. 8. Six-month follow-up  Signed, Candee Furbish, MD [facility]  02/27/2017 12:07 PM

## 2017-02-27 NOTE — Patient Instructions (Signed)

## 2017-07-11 ENCOUNTER — Other Ambulatory Visit: Payer: Self-pay | Admitting: Cardiology

## 2017-08-02 ENCOUNTER — Other Ambulatory Visit: Payer: Self-pay | Admitting: Cardiology

## 2017-08-20 ENCOUNTER — Other Ambulatory Visit: Payer: Self-pay | Admitting: Cardiology

## 2017-09-14 ENCOUNTER — Encounter: Payer: Self-pay | Admitting: Cardiology

## 2017-09-14 ENCOUNTER — Encounter (INDEPENDENT_AMBULATORY_CARE_PROVIDER_SITE_OTHER): Payer: Self-pay

## 2017-09-14 ENCOUNTER — Ambulatory Visit (INDEPENDENT_AMBULATORY_CARE_PROVIDER_SITE_OTHER): Payer: Medicare HMO | Admitting: Cardiology

## 2017-09-14 VITALS — BP 130/80 | HR 58 | Ht 68.0 in | Wt 204.8 lb

## 2017-09-14 DIAGNOSIS — I2510 Atherosclerotic heart disease of native coronary artery without angina pectoris: Secondary | ICD-10-CM

## 2017-09-14 DIAGNOSIS — I10 Essential (primary) hypertension: Secondary | ICD-10-CM

## 2017-09-14 DIAGNOSIS — I240 Acute coronary thrombosis not resulting in myocardial infarction: Secondary | ICD-10-CM | POA: Diagnosis not present

## 2017-09-14 NOTE — Patient Instructions (Signed)

## 2017-09-14 NOTE — Progress Notes (Signed)
[facility]. [address] [place], [place]  [address] Phone: [phone] Fax:  [phone]  Date:  09/14/2017   ID:  Thomas Farrell, DOB 1941-02-17, MRN [id number]  PCP:  Lawerance Cruel, MD   History of Present Illness: Thomas Farrell is a 76 y.o. male with coronary artery disease who had successful stent to right coronary artery x2,  (2009 - s/p PTCA/DES to RCA with wire-induced dissection in distal RCA, 2013 - s/p DES to [facility] and cutting balloon angioplasty to ISR to [facility]), highly calcified vessel here for followup.   Last PCI was performed in March of 2013. DES placement to distal right coronary artery with 3.0 x 20 mm Promus. First PCI was performed in 2009, also has occluded circumflex with left to left collaterals no change.[name] His ALT has been mildly elevated in the past but upon recent check it was normal. Had mild cardiomyopathy with EF 45-50% in 2009 (50-55% by echo 04/2016), suspected CKD stage III (Cr 1.3-1.4 in 04/2016), HTN, HLD. GERD.  He was admitted 04/2016 with CP and ruled out for MI. He was noted to have 12 beats of WCT. K 3.9. Nuc 04/21/16 showed prior infarct but no ischemia, LVEF 39%. 2D echo was obtained as outpatient 05/11/16 with mild focal basal hypertrophy of the septum, EF 50-55% with mild HK of the inferolateral and inferior myocardium, mod LAE.  He has been back out on the golf course without difficulty. Initial BP by the tech was 90/60 but this was with large cuff - recheck by me with regular appropriate cuff was 109/58. He states it usually runs 115/70s at home. He denies any dizziness or syncope. He reports that he f/u with his PCP already and was told that his kidney function was better  He takes care of his wife who is bedridden from prior stroke in 2013, March when he had his last stent placement. He has significant help at home as well. His wife's name is [name]. New bathtub, $6000. She is now seeing Dr. Reymundo Poll, housecalls.   Talked  about his hyperlipidemia. overall doing well with medications. He is not having any bleeding episodes with dual antiplatelet therapy which we are continuing.   Elevated ALT in the past.   09/14/17-overall he is doing well without chest pain.  He is having low back pain and is contemplating injections.  No syncope, no bleeding.  No claudication.      Wt Readings from Last 3 Encounters:  09/14/17 204 lb 12.8 oz (92.9 kg)  02/27/17 216 lb 6.4 oz (98.2 kg)  10/19/16 202 lb 3.2 oz (91.7 kg)     Past Medical History:  Diagnosis Date  . CAD in native artery    a. 2009 - s/p PTCA/DES to RCA with wire-induced dissection in distal RCA. b. 2013 - s/p DES to dRCA and cutting balloon angioplasty to ISR to mRCA  . Cancer of skin of temple    "right"  . Cardiomyopathy ([place])    a. EF 45-50% in 2009. b. 50-55% by echo 04/2016.  [name] CKD (chronic kidney disease), stage III ([place])   . GERD (gastroesophageal reflux disease)   . Hyperlipidemia   . Hypertension   . Myocardial infarction ([place]) 2009  . Obesity   . Osteoarthritis     Past Surgical History:  Procedure Laterality Date  . CARDIAC CATHETERIZATION  2009;  12/2011  . CORONARY ANGIOPLASTY WITH STENT PLACEMENT  2009; 01/2012   "~  2wk after each cath"  . INGUINAL HERNIA REPAIR Right ~ 2010  . PERCUTANEOUS CORONARY STENT INTERVENTION (PCI-S) N/A 01/25/2012   Procedure: PERCUTANEOUS CORONARY STENT INTERVENTION (PCI-S);  Surgeon: Jettie Booze, MD;  Location: [facility] CATH LAB;  Service: Cardiovascular;  Laterality: N/A;  . SKIN CANCER EXCISION Right    temple  . TONSILLECTOMY      Current Outpatient Prescriptions  Medication Sig Dispense Refill  . aspirin EC 81 MG tablet Take 1 tablet (81 mg total) by mouth daily. 90 tablet 3  . clopidogrel (PLAVIX) 75 MG tablet TAKE ONE TABLET BY MOUTH ONCE DAILY 90 tablet 1  . co-enzyme Q-10 30 MG capsule Take 200 mg by mouth daily.    [name] ezetimibe (ZETIA) 10 MG tablet TAKE ONE TABLET BY MOUTH ONCE DAILY 90  tablet 2  . isosorbide mononitrate (IMDUR) 60 MG 24 hr tablet TAKE ONE AND ONE-HALF TABLETS BY MOUTH ONCE DAILY 135 tablet 2  . lisinopril-hydrochlorothiazide (PRINZIDE,ZESTORETIC) 20-12.5 MG tablet TAKE ONE TABLET BY MOUTH ONCE DAILY 90 tablet 1  . metoprolol tartrate (LOPRESSOR) 50 MG tablet TAKE ONE TABLET BY MOUTH TWICE DAILY 180 tablet 1  . nitroGLYCERIN (NITROSTAT) 0.4 MG SL tablet Place 0.4 mg under the tongue every 5 (five) minutes as needed for chest pain (MAX 3 DOSES).    . pantoprazole (PROTONIX) 40 MG tablet Take 40 mg by mouth daily.    . rosuvastatin (CRESTOR) 40 MG tablet TAKE ONE TABLET BY MOUTH ONCE DAILY 90 tablet 2  . tamsulosin (FLOMAX) 0.4 MG CAPS capsule Take 0.4 mg by mouth daily.     No current facility-administered medications for this visit.     Allergies:    Allergies  Allergen Reactions  . Acetaminophen Diarrhea and Nausea And Vomiting  . Niacin Other (See Comments)    flushing    Social History:  The patient  reports that he quit smoking about 15 years ago. His smoking use included Cigarettes. He has a 172.00 pack-year smoking history. He has never used smokeless tobacco. He reports that he drinks about 8.4 oz of alcohol per week . He reports that he does not use drugs.   ROS:  Please see the history of present illness.   Denies any bleeding, syncope, orthopnea, PND    PHYSICAL EXAM: VS:  BP 130/80   Pulse (!) 58   Ht 5\' 8"  (1.727 m)   Wt 204 lb 12.8 oz (92.9 kg)   BMI 31.14 kg/m  GEN: Well nourished, well developed, in no acute distress  HEENT: normal  Neck: no JVD, carotid bruits, or masses Cardiac: RRR; no murmurs, rubs, or gallops,no edema  Respiratory:  clear to auscultation bilaterally, normal work of breathing GI: soft, nontender, nondistended, + BS MS: no deformity or atrophy, left elbow bursa expansion Skin: warm and dry, no rash Neuro:  Alert and Oriented x 3, Strength and sensation are intact Psych: euthymic mood, full affect    EKG:   Today, 02/27/17-sinus bradycardia rate 50 with no other abnormalities personally viewed-prior sinus bradycardia rate 53 with old inferior posterior infarct pattern personally viewed with mild peaking of T waves in V2, V3, no significant change from prior-06/04/14: Sinus bradycardia rate 51, mild peaking of T waves in V2, V3. No significant ST changes, J-point elevation noted in V3, V4, V5.  Labs: 05/27/16-LDL 67, HDL 55, creatinine 0.91 2017- ALT 37  ASSESSMENT AND PLAN:  Old myocardial infarction-overall doing very well.  No anginal symptoms currently.  Coronary artery disease without  angina-occluded circumflex as above. Previously placed RCA stents. Continue with both Plavix and aspirin 81 mg. no signs of bleeding, nuclear stress test 04/2016 was nonischemic.  Doing well  Elevated LFTs-currently minimally elevated ALT of 58.  Fairly chronic  Hyperlipidemia -- LDL excellent at 65. Secondary prevention. Crestor, Zetia. Doing well.  No myalgias. No changes  Hypertension-currently doing well. No changes made.  Continue current regimen  History of heavy tobacco use- no significant changes.  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 On lisinopril. Good for renal protection. No changes  Six-month follow-up  Signed, Candee Furbish, MD [facility]  09/14/2017 3:19 PM

## 2017-09-19 ENCOUNTER — Other Ambulatory Visit: Payer: Self-pay | Admitting: Cardiology

## 2018-01-16 ENCOUNTER — Other Ambulatory Visit: Payer: Self-pay | Admitting: Cardiology

## 2018-03-17 ENCOUNTER — Other Ambulatory Visit: Payer: Self-pay | Admitting: Cardiology

## 2018-03-23 ENCOUNTER — Other Ambulatory Visit: Payer: Self-pay | Admitting: Cardiology

## 2018-04-16 ENCOUNTER — Other Ambulatory Visit: Payer: Self-pay | Admitting: Cardiology

## 2018-07-10 ENCOUNTER — Other Ambulatory Visit: Payer: Self-pay | Admitting: Cardiology

## 2018-07-15 ENCOUNTER — Other Ambulatory Visit: Payer: Self-pay | Admitting: Cardiology

## 2018-09-19 ENCOUNTER — Encounter: Payer: Self-pay | Admitting: Cardiology

## 2018-09-19 ENCOUNTER — Ambulatory Visit: Payer: Medicare HMO | Admitting: Cardiology

## 2018-09-19 VITALS — BP 134/80 | HR 52 | Ht 68.0 in | Wt 203.8 lb

## 2018-09-19 DIAGNOSIS — E7800 Pure hypercholesterolemia, unspecified: Secondary | ICD-10-CM | POA: Diagnosis not present

## 2018-09-19 DIAGNOSIS — I10 Essential (primary) hypertension: Secondary | ICD-10-CM | POA: Diagnosis not present

## 2018-09-19 DIAGNOSIS — I2510 Atherosclerotic heart disease of native coronary artery without angina pectoris: Secondary | ICD-10-CM | POA: Diagnosis not present

## 2018-09-19 DIAGNOSIS — I240 Acute coronary thrombosis not resulting in myocardial infarction: Secondary | ICD-10-CM | POA: Diagnosis not present

## 2018-09-19 NOTE — Patient Instructions (Addendum)
Your physician has recommended you make the following change in your medication:  [employer] wants you to follow-up in: [place] will receive a reminder letter in the mail two months in advance. If you don't receive a letter, please call our office to schedule the follow-up appointment.

## 2018-09-19 NOTE — Progress Notes (Signed)
Cardiology Office Note:    Date:  09/19/2018   ID:  Thomas Farrell, DOB 03-26-1941, MRN [id number]  PCP:  Lawerance Cruel, MD  Cardiologist:  No primary care provider on file.  Electrophysiologist:  None   Referring MD: Lawerance Cruel, MD     History of Present Illness:    Thomas Farrell is a 77 y.o. male here for follow-up of coronary artery disease.  stent to right coronary artery x2, (2009 - s/p PTCA/DES to RCA with wire-induced dissection in distal RCA, 2013 - s/p DES to dRCA and cutting balloon angioplasty to ISR to [facility]), highly calcified vessel here for followup.   Last PCI was performed in March of 2013. DES placement to distal right coronary artery with 3.0 x 20 mm Promus. First PCI was performed in 2009, also has occluded circumflex with left to left collaterals no change.Thomas Farrell His ALT has been mildly elevated in the past but upon recent check it was normal. Had mild cardiomyopathy with EF 45-50% in 2009 (50-55% by echo 04/2016), suspected CKD stage III (Cr 1.3-1.4 in 04/2016), HTN, HLD. GERD.  He was admitted 04/2016 with CP and ruled out for MI. He was noted to have 12 beats of WCT. K 3.9. Nuc 04/21/16 showed prior infarct but no ischemia, LVEF 39%. 2D echo was obtained as outpatient 05/11/16 with mild focal basal hypertrophy of the septum, EF 50-55% with mild HK of the inferolateral and inferior myocardium, mod LAE.  Unfortunately, in 12-Sep-2018, his wife Thomas Farrell died after a 6-1/2-year battle post stroke.  Feeding tube.  He took such great care of her in fact installed a $6000 bathtub to help her.  They enjoyed the services of doctors making house calls with Dr. Reymundo Poll.  He is back to playing golf.  No fevers chills nausea vomiting syncope bleeding.  Past Medical History:  Diagnosis Date  . CAD in native artery    a. 2009 - s/p PTCA/DES to RCA with wire-induced dissection in distal RCA. b. 2013 - s/p DES to dRCA and cutting balloon angioplasty to ISR to mRCA    . Cancer of skin of temple    "right"  . Cardiomyopathy ([place])    a. EF 45-50% in 2009. b. 50-55% by echo 04/2016.  Thomas Farrell CKD (chronic kidney disease), stage III ([place])   . GERD (gastroesophageal reflux disease)   . Hyperlipidemia   . Hypertension   . Myocardial infarction ([place]) 2009  . Obesity   . Osteoarthritis     Past Surgical History:  Procedure Laterality Date  . CARDIAC CATHETERIZATION  2009;  12/2011  . CORONARY ANGIOPLASTY WITH STENT PLACEMENT  2009; 01/2012   "~ 2wk after each cath"  . INGUINAL HERNIA REPAIR Right ~ 2010  . PERCUTANEOUS CORONARY STENT INTERVENTION (PCI-S) N/A 01/25/2012   Procedure: PERCUTANEOUS CORONARY STENT INTERVENTION (PCI-S);  Surgeon: Jettie Booze, MD;  Location: [facility] CATH LAB;  Service: Cardiovascular;  Laterality: N/A;  . SKIN CANCER EXCISION Right    temple  . TONSILLECTOMY      Current Medications: Current Meds  Medication Sig  . clopidogrel (PLAVIX) 75 MG tablet Take 1 tablet (75 mg total) by mouth daily. Please make yearly appt with Dr. Marlou Porch for October for future refills. 1st attempt  . co-enzyme Q-10 30 MG capsule Take 200 mg by mouth daily.  Thomas Farrell ezetimibe (ZETIA) 10 MG tablet TAKE ONE TABLET BY MOUTH ONCE DAILY  . isosorbide mononitrate (IMDUR) 60 MG 24 hr tablet Take  1.5 tablets (90 mg total) by mouth daily. Please make yearly appt with Dr. Marlou Porch for October for future refills. 1st attempt  . lisinopril-hydrochlorothiazide (PRINZIDE,ZESTORETIC) 20-12.5 MG tablet TAKE 1 TABLET BY MOUTH ONCE DAILY  . metoprolol tartrate (LOPRESSOR) 50 MG tablet TAKE 1 TABLET BY MOUTH TWICE DAILY  . nitroGLYCERIN (NITROSTAT) 0.4 MG SL tablet Place 0.4 mg under the tongue every 5 (five) minutes as needed for chest pain (MAX 3 DOSES).  . pantoprazole (PROTONIX) 40 MG tablet Take 40 mg by mouth daily.  . rosuvastatin (CRESTOR) 40 MG tablet TAKE 1 TABLET BY MOUTH ONCE DAILY  . [DISCONTINUED] aspirin EC 81 MG tablet Take 1 tablet (81 mg total) by mouth daily.      Allergies:   Acetaminophen and Niacin   Social History   Socioeconomic History  . Marital status: Married    Spouse name: Not on file  . Number of children: Not on file  . Years of education: Not on file  . Highest education level: Not on file  Occupational History  . Not on file  Social Needs  . Financial resource strain: Not on file  . Food insecurity:    Worry: Not on file    Inability: Not on file  . Transportation needs:    Medical: Not on file    Non-medical: Not on file  Tobacco Use  . Smoking status: Former Smoker    Packs/day: 4.00    Years: 43.00    Pack years: 172.00    Types: Cigarettes    Last attempt to quit: 01/24/2002    Years since quitting: 16.6  . Smokeless tobacco: Never Used  Substance and Sexual Activity  . Alcohol use: Yes    Alcohol/week: 14.0 standard drinks    Types: 14 Glasses of wine per week  . Drug use: No  . Sexual activity: Not Currently  Lifestyle  . Physical activity:    Days per week: Not on file    Minutes per session: Not on file  . Stress: Not on file  Relationships  . Social connections:    Talks on phone: Not on file    Gets together: Not on file    Attends religious service: Not on file    Active member of club or organization: Not on file    Attends meetings of clubs or organizations: Not on file    Relationship status: Not on file  Other Topics Concern  . Not on file  Social History Narrative  . Not on file     Family History: The patient's family history includes Heart attack in his brother, father, and paternal uncle; Hypertension in his other. There is no history of Stroke.  ROS:   Please see the history of present illness.     All other systems reviewed and are negative.  EKGs/Labs/Other Studies Reviewed:    The following studies were reviewed today: Prior office notes lab work EKG reviewed  EKG:  EKG is  ordered today.  The ekg ordered today demonstrates sinus bradycardia rate 52 with old inferior  posterior infarct pattern.  No significant change from prior.  Recent Labs: No results found for requested labs within last 8760 hours.  Recent Lipid Panel    Component Value Date/Time   CHOL 131 11/22/2013 1332   TRIG 94.0 11/22/2013 1332   HDL 50.40 11/22/2013 1332   CHOLHDL 3 11/22/2013 1332   VLDL 18.8 11/22/2013 1332   LDLCALC 62 11/22/2013 1332    Physical Exam:  VS:  BP 134/80   Pulse (!) 52   Ht 5\' 8"  (1.727 m)   Wt 203 lb 12.8 oz (92.4 kg)   SpO2 97%   BMI 30.99 kg/m     Wt Readings from Last 3 Encounters:  09/19/18 203 lb 12.8 oz (92.4 kg)  09/14/17 204 lb 12.8 oz (92.9 kg)  02/27/17 216 lb 6.4 oz (98.2 kg)     GEN:  Well nourished, well developed in no acute distress HEENT: Normal NECK: No JVD; No carotid bruits LYMPHATICS: No lymphadenopathy CARDIAC: RRR, no murmurs, rubs, gallops RESPIRATORY:  Clear to auscultation without rales, wheezing or rhonchi  ABDOMEN: Soft, non-tender, non-distended MUSCULOSKELETAL:  No edema; No deformity  SKIN: Warm and dry NEUROLOGIC:  Alert and oriented x 3 PSYCHIATRIC:  Normal affect   ASSESSMENT:    1. Atherosclerosis of native coronary artery of native heart without angina pectoris   2. Essential hypertension   3. RCA occlusion ([place])   4. Pure hypercholesterolemia    PLAN:    In order of problems listed above:  Coronary artery disease with old MI -Doing well no anginal symptoms RCA stents nuclear stress test 2017 no ischemia. - I will go ahead and change to Plavix monotherapy at this point.  Reduce bleeding risk.  Stop aspirin.  Mildly elevated LFTs - Last checked 49, previously 58.  Chronic.  Hyperlipidemia -LDL between 65 and 67, excellent with Crestor and Zetia.  No myalgias.  Essential hypertension - Well-controlled no changes made.  Prior history of heavy tobacco use - Overall doing well, does not manifest any signs of COPD.  One year follow up.  Medication Adjustments/Labs and Tests  Ordered: Current medicines are reviewed at length with the patient today.  Concerns regarding medicines are outlined above.  Orders Placed This Encounter  Procedures  . EKG 12-Lead   No orders of the defined types were placed in this encounter.   Patient Instructions  Your physician has recommended you make the following change in your medication:  STOP ASPIRIN  Your physician wants you to follow-up in: [place] will receive a reminder letter in the mail two months in advance. If you don't receive a letter, please call our office to schedule the follow-up appointment.     Signed, Candee Furbish, MD  09/19/2018 3:46 PM    [place]

## 2018-10-09 ENCOUNTER — Other Ambulatory Visit: Payer: Self-pay | Admitting: Cardiology

## 2018-10-10 ENCOUNTER — Other Ambulatory Visit: Payer: Self-pay | Admitting: Cardiology

## 2018-10-10 MED ORDER — ISOSORBIDE MONONITRATE ER 60 MG PO TB24: 90.0000 mg | ORAL_TABLET | Freq: Every day | ORAL | 3 refills | Status: DC

## 2019-01-16 ENCOUNTER — Other Ambulatory Visit: Payer: Self-pay | Admitting: Cardiology

## 2019-03-10 ENCOUNTER — Other Ambulatory Visit: Payer: Self-pay | Admitting: Cardiology

## 2019-07-21 ENCOUNTER — Other Ambulatory Visit: Payer: Self-pay | Admitting: Cardiology

## 2019-08-14 ENCOUNTER — Other Ambulatory Visit: Payer: Self-pay | Admitting: Cardiology

## 2019-08-14 MED ORDER — METOPROLOL TARTRATE 50 MG PO TABS: 50.0000 mg | ORAL_TABLET | Freq: Two times a day (BID) | ORAL | 0 refills | Status: DC

## 2019-08-14 MED ORDER — EZETIMIBE 10 MG PO TABS: 10.0000 mg | ORAL_TABLET | Freq: Every day | ORAL | 0 refills | Status: DC

## 2019-08-14 MED ORDER — LISINOPRIL-HYDROCHLOROTHIAZIDE 20-12.5 MG PO TABS: 1.0000 | ORAL_TABLET | Freq: Every day | ORAL | 0 refills | Status: DC

## 2019-08-14 MED ORDER — ISOSORBIDE MONONITRATE ER 60 MG PO TB24: 90.0000 mg | ORAL_TABLET | Freq: Every day | ORAL | 0 refills | Status: DC

## 2019-08-14 MED ORDER — CLOPIDOGREL BISULFATE 75 MG PO TABS: 75.0000 mg | ORAL_TABLET | Freq: Every day | ORAL | 0 refills | Status: DC

## 2019-08-14 MED ORDER — ROSUVASTATIN CALCIUM 40 MG PO TABS: 40.0000 mg | ORAL_TABLET | Freq: Every day | ORAL | 0 refills | Status: DC

## 2019-08-14 MED ORDER — NITROGLYCERIN 0.4 MG SL SUBL: 0.4000 mg | SUBLINGUAL_TABLET | SUBLINGUAL | 0 refills | Status: DC | PRN

## 2019-09-19 ENCOUNTER — Encounter (INDEPENDENT_AMBULATORY_CARE_PROVIDER_SITE_OTHER): Payer: Self-pay

## 2019-09-19 ENCOUNTER — Other Ambulatory Visit: Payer: Self-pay

## 2019-09-19 ENCOUNTER — Encounter: Payer: Self-pay | Admitting: Cardiology

## 2019-09-19 ENCOUNTER — Ambulatory Visit: Payer: Medicare HMO | Admitting: Cardiology

## 2019-09-19 VITALS — BP 136/80 | HR 60 | Ht 68.0 in | Wt 202.2 lb

## 2019-09-19 DIAGNOSIS — I240 Acute coronary thrombosis not resulting in myocardial infarction: Secondary | ICD-10-CM | POA: Diagnosis not present

## 2019-09-19 DIAGNOSIS — E7800 Pure hypercholesterolemia, unspecified: Secondary | ICD-10-CM | POA: Diagnosis not present

## 2019-09-19 DIAGNOSIS — I2510 Atherosclerotic heart disease of native coronary artery without angina pectoris: Secondary | ICD-10-CM

## 2019-09-19 DIAGNOSIS — I10 Essential (primary) hypertension: Secondary | ICD-10-CM

## 2019-09-19 NOTE — Progress Notes (Signed)
Cardiology Office Note:    Date:  09/19/2019   ID:  Thomas Farrell, DOB 1941-08-08, MRN [id number]  PCP:  Lawerance Cruel, MD  Cardiologist:  Candee Furbish, MD  Electrophysiologist:  None   Referring MD: Lawerance Cruel, MD     History of Present Illness:    Thomas Farrell is a 78 y.o. male here for follow-up of coronary artery disease.  stent to right coronary artery x2, (2009 - s/p PTCA/DES to RCA with wire-induced dissection in distal RCA, 2013 - s/p DES to dRCA and cutting balloon angioplasty to ISR to [facility]), highly calcified vessel here for followup.   Last PCI was performed in March of 2013. DES placement to distal right coronary artery with 3.0 x 20 mm Promus. First PCI was performed in 2009, also has occluded circumflex with left to left collaterals no change.[name] His ALT has been mildly elevated in the past but upon recent check it was normal. Had mild cardiomyopathy with EF 45-50% in 2009 (50-55% by echo 04/2016), suspected CKD stage III (Cr 1.3-1.4 in 04/2016), HTN, HLD. GERD.  He was admitted 04/2016 with CP and ruled out for MI. He was noted to have 12 beats of WCT. K 3.9. Nuc 04/21/16 showed prior infarct but no ischemia, LVEF 39%. 2D echo was obtained as outpatient 05/11/16 with mild focal basal hypertrophy of the septum, EF 50-55% with mild HK of the inferolateral and inferior myocardium, mod LAE.  Unfortunately, in 08/25/2018, his wife [name] died after a 6-1/2-year battle post stroke.  Feeding tube.  He took such great care of her in fact installed a $6000 bathtub to help her.  They enjoyed the services of doctors making house calls with Dr. Reymundo Poll.  09/19/19 -here for the follow-up of CAD.  He has been doing very well.  No fevers chills nausea vomiting syncope bleeding.  He is impressed with his cholesterol numbers after being on the Zetia and the Crestor.  In fact he told a friend of his and [place] about Zetia and she was able to lower her  cholesterol to.  She was appreciative.  Past Medical History:  Diagnosis Date  . CAD in native artery    a. 2009 - s/p PTCA/DES to RCA with wire-induced dissection in distal RCA. b. 2013 - s/p DES to dRCA and cutting balloon angioplasty to ISR to mRCA  . Cancer of skin of temple    "right"  . Cardiomyopathy ([place])    a. EF 45-50% in 2009. b. 50-55% by echo 04/2016.  [name] CKD (chronic kidney disease), stage III   . GERD (gastroesophageal reflux disease)   . Hyperlipidemia   . Hypertension   . Myocardial infarction ([place]) 2009  . Obesity   . Osteoarthritis     Past Surgical History:  Procedure Laterality Date  . CARDIAC CATHETERIZATION  2009;  12/2011  . CORONARY ANGIOPLASTY WITH STENT PLACEMENT  2009; 01/2012   "~ 2wk after each cath"  . INGUINAL HERNIA REPAIR Right ~ 2010  . PERCUTANEOUS CORONARY STENT INTERVENTION (PCI-S) N/A 01/25/2012   Procedure: PERCUTANEOUS CORONARY STENT INTERVENTION (PCI-S);  Surgeon: Jettie Booze, MD;  Location: [facility] CATH LAB;  Service: Cardiovascular;  Laterality: N/A;  . SKIN CANCER EXCISION Right    temple  . TONSILLECTOMY      Current Medications: Current Meds  Medication Sig  . clopidogrel (PLAVIX) 75 MG tablet Take 1 tablet (75 mg total) by mouth daily. Please keep upcoming appt  in October with Dr. Marlou Porch before anymore refills. Thank you  . co-enzyme Q-10 30 MG capsule Take 200 mg by mouth daily.  [name] ezetimibe (ZETIA) 10 MG tablet Take 1 tablet (10 mg total) by mouth daily. Please keep upcoming appt in October with Dr. Marlou Porch before anymore refills. Thank you  . isosorbide mononitrate (IMDUR) 60 MG 24 hr tablet Take 1.5 tablets (90 mg total) by mouth daily. Please keep upcoming appt in October with Dr. Marlou Porch before anymore refills. Thank you  . lisinopril-hydrochlorothiazide (ZESTORETIC) 20-12.5 MG tablet Take 1 tablet by mouth daily. Please keep upcoming appt in October with Dr. Marlou Porch before anymore refills. Thank you  . metoprolol tartrate  (LOPRESSOR) 50 MG tablet Take 1 tablet (50 mg total) by mouth 2 (two) times daily. Please keep upcoming appt in October with Dr. Marlou Porch before anymore refills. Thank you  . nitroGLYCERIN (NITROSTAT) 0.4 MG SL tablet Place 1 tablet (0.4 mg total) under the tongue every 5 (five) minutes as needed for chest pain (MAX 3 DOSES). Please keep upcoming appt in October with Dr. Marlou Porch before anymore refills. Thank you  . pantoprazole (PROTONIX) 40 MG tablet Take 40 mg by mouth daily.  . rosuvastatin (CRESTOR) 40 MG tablet Take 1 tablet (40 mg total) by mouth daily. Please keep upcoming appt in October with Dr. Marlou Porch before anymore refills. Thank you     Allergies:   Acetaminophen and Niacin   Social History   Socioeconomic History  . Marital status: Married    Spouse name: Not on file  . Number of children: Not on file  . Years of education: Not on file  . Highest education level: Not on file  Occupational History  . Not on file  Social Needs  . Financial resource strain: Not on file  . Food insecurity    Worry: Not on file    Inability: Not on file  . Transportation needs    Medical: Not on file    Non-medical: Not on file  Tobacco Use  . Smoking status: Former Smoker    Packs/day: 4.00    Years: 43.00    Pack years: 172.00    Types: Cigarettes    Quit date: 01/24/2002    Years since quitting: 17.6  . Smokeless tobacco: Never Used  Substance and Sexual Activity  . Alcohol use: Yes    Alcohol/week: 14.0 standard drinks    Types: 14 Glasses of wine per week  . Drug use: No  . Sexual activity: Not Currently  Lifestyle  . Physical activity    Days per week: Not on file    Minutes per session: Not on file  . Stress: Not on file  Relationships  . Social Herbalist on phone: Not on file    Gets together: Not on file    Attends religious service: Not on file    Active member of club or organization: Not on file    Attends meetings of clubs or organizations: Not on file     Relationship status: Not on file  Other Topics Concern  . Not on file  Social History Narrative  . Not on file     Family History: The patient's family history includes Heart attack in his brother, father, and paternal uncle; Hypertension in an other family member. There is no history of Stroke.  ROS:   Please see the history of present illness.     All other systems reviewed and are negative.  EKGs/Labs/Other  Studies Reviewed:    The following studies were reviewed today: Prior office notes lab work EKG reviewed  EKG: 09/19/2019-sinus rhythm PVC old inferior infarct posterior no changes sinus bradycardia rate 52 with old inferior posterior infarct pattern.  No significant change from prior.  Recent Labs: No results found for requested labs within last 8760 hours.  Recent Lipid Panel    Component Value Date/Time   CHOL 131 11/22/2013 1332   TRIG 94.0 11/22/2013 1332   HDL 50.40 11/22/2013 1332   CHOLHDL 3 11/22/2013 1332   VLDL 18.8 11/22/2013 1332   LDLCALC 62 11/22/2013 1332    Physical Exam:    VS:  BP 136/80   Pulse 60   Ht 5\' 8"  (1.727 m)   Wt 202 lb 3.2 oz (91.7 kg)   SpO2 94%   BMI 30.74 kg/m     Wt Readings from Last 3 Encounters:  09/19/19 202 lb 3.2 oz (91.7 kg)  09/19/18 203 lb 12.8 oz (92.4 kg)  09/14/17 204 lb 12.8 oz (92.9 kg)     GEN: Well nourished, well developed, in no acute distress  HEENT: normal  Neck: no JVD, carotid bruits, or masses Cardiac: RRR; no murmurs, rubs, or gallops,no edema  Respiratory:  clear to auscultation bilaterally, normal work of breathing GI: soft, nontender, nondistended, + BS MS: no deformity or atrophy  Skin: warm and dry, no rash Neuro:  Alert and Oriented x 3, Strength and sensation are intact Psych: euthymic mood, full affect   ASSESSMENT:    1. Essential hypertension   2. Atherosclerosis of native coronary artery of native heart without angina pectoris   3. RCA occlusion ([place])   4. Pure  hypercholesterolemia    PLAN:    In order of problems listed above:  Coronary artery disease with old MI -Doing well no anginal symptoms RCA stents nuclear stress test 2017 no ischemia. -Feels well.  Last echo showed EF of 50 to 55% in 2017. Plavix monotherapy.  Mildly elevated LFTs - Last checked 49, previously 58.  Chronic.  Stable.  Hyperlipidemia -LDL between 65 and 67, excellent with Crestor and Zetia.  No myalgias.  Blood work recently checked again.  Doing well.  Essential hypertension - Well-controlled no changes made.  Excellent control.  Prior history of heavy tobacco use - Overall doing well, does not manifest any signs of COPD.  Stable.  One year follow up.  Medication Adjustments/Labs and Tests Ordered: Current medicines are reviewed at length with the patient today.  Concerns regarding medicines are outlined above.  Orders Placed This Encounter  Procedures  . EKG 12-Lead   No orders of the defined types were placed in this encounter.   Patient Instructions  Medication Instructions:  The current medical regimen is effective;  continue present plan and medications.  *If you need a refill on your cardiac medications before your next appointment, please call your pharmacy*  Follow-Up: At [facility], you and your health needs are our priority.  As part of our continuing mission to provide you with exceptional heart care, we have created designated Provider Care Teams.  These Care Teams include your primary Cardiologist (physician) and Advanced Practice Providers (APPs -  Physician Assistants and Nurse Practitioners) who all work together to provide you with the care you need, when you need it.  Your next appointment:   12 months  The format for your next appointment:   In Person  Provider:   You may see Candee Furbish, MD or  one of the following Advanced Practice Providers on your designated Care Team:    Truitt Merle, NP  Cecilie Kicks, NP  Kathyrn Drown, NP   Thank you for choosing [facility]!!         Signed, Candee Furbish, MD  09/19/2019 10:49 AM    [place]

## 2019-09-19 NOTE — Patient Instructions (Signed)
Medication Instructions:  The current medical regimen is effective;  continue present plan and medications.  *If you need a refill on your cardiac medications before your next appointment, please call your pharmacy*  Follow-Up: At [facility], you and your health needs are our priority.  As part of our continuing mission to provide you with exceptional heart care, we have created designated Provider Care Teams.  These Care Teams include your primary Cardiologist (physician) and Advanced Practice Providers (APPs -  Physician Assistants and Nurse Practitioners) who all work together to provide you with the care you need, when you need it.  Your next appointment:   12 months  The format for your next appointment:   In Person  Provider:   You may see Mark Skains, MD or one of the following Advanced Practice Providers on your designated Care Team:    Lori Gerhardt, NP  Laura Ingold, NP  Jill McDaniel, NP   Thank you for choosing [facility]!!      

## 2019-10-08 ENCOUNTER — Other Ambulatory Visit: Payer: Self-pay | Admitting: Cardiology

## 2019-12-11 ENCOUNTER — Ambulatory Visit: Payer: Medicare HMO

## 2020-01-03 ENCOUNTER — Other Ambulatory Visit: Payer: Self-pay | Admitting: Cardiology

## 2020-02-07 ENCOUNTER — Telehealth: Payer: Self-pay | Admitting: *Deleted

## 2020-02-07 DIAGNOSIS — R6889 Other general symptoms and signs: Secondary | ICD-10-CM

## 2020-02-07 NOTE — Telephone Encounter (Signed)
Received PAD screening results completed by [facility] indicating moderate narrowing in left leg (0.72) right normal at 1.25.  Per Dr Marlou Porch - pt is ordered to have LEA with ABIs.  Pt is aware he will be called to be scheduled for this to be completed at the [facility] office.

## 2020-02-12 ENCOUNTER — Other Ambulatory Visit (HOSPITAL_COMMUNITY): Payer: Self-pay | Admitting: Cardiology

## 2020-02-12 DIAGNOSIS — I739 Peripheral vascular disease, unspecified: Secondary | ICD-10-CM

## 2020-02-20 ENCOUNTER — Other Ambulatory Visit: Payer: Self-pay

## 2020-02-20 ENCOUNTER — Ambulatory Visit (HOSPITAL_COMMUNITY)
Admission: RE | Admit: 2020-02-20 | Discharge: 2020-02-20 | Disposition: A | Discharge status: Home / Self Care | Payer: Medicare HMO | Source: Ambulatory Visit | Attending: Cardiology | Admitting: Cardiology

## 2020-02-20 DIAGNOSIS — I739 Peripheral vascular disease, unspecified: Secondary | ICD-10-CM | POA: Diagnosis not present

## 2020-02-20 DIAGNOSIS — R6889 Other general symptoms and signs: Secondary | ICD-10-CM | POA: Diagnosis present

## 2020-02-21 ENCOUNTER — Telehealth: Payer: Self-pay | Admitting: Cardiology

## 2020-02-21 NOTE — Telephone Encounter (Signed)
Jerline Pain, MD  02/21/2020 11:16 AM EDT    Normal lower extremity blood flow. Candee Furbish MD   Patient aware of results.

## 2020-02-21 NOTE — Telephone Encounter (Signed)
Patient returning Pam's call in regards to results.

## 2020-04-10 ENCOUNTER — Telehealth: Payer: Self-pay | Admitting: Cardiology

## 2020-04-10 NOTE — Telephone Encounter (Signed)
Pt is calling in because he is concerned about his BP being too low. His recent reading was 98/62 HR 84. The patient has been asymptomatic, denies any SOB, CP, lightheadedness, dizziness. I advised the patient that he should check his BP before taking his medications and If his BP is less than 90/60 or HR below 50 bpm then he should hold his Metoprolol and call the office. Pt has appt on 5/25 with Dr. Marlou Porch. I asked the patient to keep a record of his BP readings and bring them to his upcoming OV. Pt verbalized understanding.

## 2020-04-10 NOTE — Telephone Encounter (Signed)
Pt c/o BP issue: STAT if pt c/o blurred vision, one-sided weakness or slurred speech  1. What are your last 5 BP readings? *Just a few minutes ago it was 98/62, today they have been running like that  2. Are you having any other symptoms (ex. Dizziness, headache, blurred vision, passed out)? no  3. What is your BP issue?  Pt's blood pressure been running low, he wanted an appointment. I made appointment for 04-15-20, please call to evaluate

## 2020-04-14 ENCOUNTER — Encounter: Payer: Self-pay | Admitting: Cardiology

## 2020-04-14 ENCOUNTER — Ambulatory Visit: Payer: Medicare HMO | Admitting: Cardiology

## 2020-04-14 ENCOUNTER — Other Ambulatory Visit: Payer: Self-pay

## 2020-04-14 VITALS — BP 110/70 | HR 68 | Ht 68.0 in | Wt 207.0 lb

## 2020-04-14 DIAGNOSIS — I10 Essential (primary) hypertension: Secondary | ICD-10-CM

## 2020-04-14 DIAGNOSIS — I2510 Atherosclerotic heart disease of native coronary artery without angina pectoris: Secondary | ICD-10-CM

## 2020-04-14 MED ORDER — LISINOPRIL 20 MG PO TABS: 20.0000 mg | ORAL_TABLET | Freq: Every day | ORAL | 3 refills | Status: DC

## 2020-04-14 NOTE — Progress Notes (Signed)
Cardiology Office Note:    Date:  04/14/2020   ID:  Thomas Farrell, DOB [birth year], MRN [id number]  PCP:  Lawerance Cruel, MD  Cardiologist:  Candee Furbish, MD  Electrophysiologist:  None   Referring MD: Lawerance Cruel, MD    History of Present Illness:    Thomas Farrell is a 79 y.o. male here for the follow-up of hypertension.  Blood pressure reading was 98/62 recently.  Heart rate 84.  Was playing golf too weak. Like fall over. BP 60's. Ate some country ham. Better BP then. Inner ear infection at the time.   Past Medical History:  Diagnosis Date  . CAD in native artery    a. 2009 - s/p PTCA/DES to RCA with wire-induced dissection in distal RCA. b. 2013 - s/p DES to dRCA and cutting balloon angioplasty to ISR to mRCA  . Cancer of skin of temple    "right"  . Cardiomyopathy ([place])    a. EF 45-50% in 2009. b. 50-55% by echo 04/2016.  [name] CKD (chronic kidney disease), stage III   . GERD (gastroesophageal reflux disease)   . Hyperlipidemia   . Hypertension   . Myocardial infarction ([place]) 2009  . Obesity   . Osteoarthritis     Past Surgical History:  Procedure Laterality Date  . CARDIAC CATHETERIZATION  2009;  12/2011  . CORONARY ANGIOPLASTY WITH STENT PLACEMENT  2009; 01/2012   "~ 2wk after each cath"  . INGUINAL HERNIA REPAIR Right ~ 2010  . PERCUTANEOUS CORONARY STENT INTERVENTION (PCI-S) N/A 01/25/2012   Procedure: PERCUTANEOUS CORONARY STENT INTERVENTION (PCI-S);  Surgeon: Jettie Booze, MD;  Location: [facility] CATH LAB;  Service: Cardiovascular;  Laterality: N/A;  . SKIN CANCER EXCISION Right    temple  . TONSILLECTOMY      Current Medications: Current Meds  Medication Sig  . clopidogrel (PLAVIX) 75 MG tablet Take 1 tablet (75 mg total) by mouth daily.  [name] co-enzyme Q-10 30 MG capsule Take 200 mg by mouth daily.  [name] ezetimibe (ZETIA) 10 MG tablet Take 1 tablet (10 mg total) by mouth daily.  . isosorbide mononitrate (IMDUR) 60 MG 24 hr tablet TAKE 1.5 TABLETS  (90 MG TOTAL) BY MOUTH DAILY.  . metoprolol tartrate (LOPRESSOR) 50 MG tablet TAKE 1 TABLET (50 MG TOTAL) BY MOUTH 2 (TWO) TIMES DAILY.  . nitroGLYCERIN (NITROSTAT) 0.4 MG SL tablet Place 1 tablet (0.4 mg total) under the tongue every 5 (five) minutes as needed for chest pain (MAX 3 DOSES). Please keep upcoming appt in October with Dr. Marlou Porch before anymore refills. Thank you  . pantoprazole (PROTONIX) 40 MG tablet Take 40 mg by mouth daily.  . rosuvastatin (CRESTOR) 40 MG tablet TAKE 1 TABLET (40 MG TOTAL) BY MOUTH DAILY.  . tamsulosin (FLOMAX) 0.4 MG CAPS capsule Take 0.4 mg by mouth daily.  . [DISCONTINUED] lisinopril-hydrochlorothiazide (ZESTORETIC) 20-12.5 MG tablet Take 1 tablet by mouth daily.     Allergies:   Acetaminophen and Niacin   Social History   Socioeconomic History  . Marital status: Married    Spouse name: Not on file  . Number of children: Not on file  . Years of education: Not on file  . Highest education level: Not on file  Occupational History  . Not on file  Tobacco Use  . Smoking status: Former Smoker    Packs/day: 4.00    Years: 43.00    Pack years: 172.00    Types: Cigarettes    Quit date:  01/24/2002    Years since quitting: 18.2  . Smokeless tobacco: Never Used  Substance and Sexual Activity  . Alcohol use: Yes    Alcohol/week: 14.0 standard drinks    Types: 14 Glasses of wine per week  . Drug use: No  . Sexual activity: Not Currently  Other Topics Concern  . Not on file  Social History Narrative  . Not on file   Social Determinants of Health   Financial Resource Strain:   . Difficulty of Paying Living Expenses:   Food Insecurity:   . Worried About Charity fundraiser in the Last Year:   . Arboriculturist in the Last Year:   Transportation Needs:   . Film/video editor (Medical):   [name] Lack of Transportation (Non-Medical):   Physical Activity:   . Days of Exercise per Week:   . Minutes of Exercise per Session:   Stress:   . Feeling of  Stress :   Social Connections:   . Frequency of Communication with Friends and Family:   . Frequency of Social Gatherings with Friends and Family:   . Attends Religious Services:   . Active Member of Clubs or Organizations:   . Attends Archivist Meetings:   [name] Marital Status:      Family History: The patient's family history includes Heart attack in his brother, father, and paternal uncle; Hypertension in an other family member. There is no history of Stroke.  ROS:   Please see the history of present illness.     All other systems reviewed and are negative.  EKGs/Labs/Other Studies Reviewed:    The following studies were reviewed today:   EKG:  EKG is  ordered today.  The ekg ordered today demonstrates 68 NSR  Recent Labs: No results found for requested labs within last 8760 hours.  Recent Lipid Panel    Component Value Date/Time   CHOL 131 11/22/2013 1332   TRIG 94.0 11/22/2013 1332   HDL 50.40 11/22/2013 1332   CHOLHDL 3 11/22/2013 1332   VLDL 18.8 11/22/2013 1332   LDLCALC 62 11/22/2013 1332    Physical Exam:    VS:  BP 110/70   Pulse 68   Ht 5\' 8"  (1.727 m)   Wt 207 lb (93.9 kg)   SpO2 94%   BMI 31.47 kg/m     Wt Readings from Last 3 Encounters:  04/14/20 207 lb (93.9 kg)  09/19/19 202 lb 3.2 oz (91.7 kg)  09/19/18 203 lb 12.8 oz (92.4 kg)     GEN:  Well nourished, well developed in no acute distress HEENT: Normal NECK: No JVD; No carotid bruits LYMPHATICS: No lymphadenopathy CARDIAC: RRR, no murmurs, rubs, gallops RESPIRATORY:  Clear to auscultation without rales, wheezing or rhonchi  ABDOMEN: Soft, non-tender, non-distended MUSCULOSKELETAL:  No edema; No deformity  SKIN: Warm and dry NEUROLOGIC:  Alert and oriented x 3 PSYCHIATRIC:  Normal affect   ASSESSMENT:    1. Atherosclerosis of native coronary artery of native heart without angina pectoris   2. Essential hypertension    PLAN:    In order of problems listed  above:  Periodic hypotension -We will go ahead and stop the HCTZ portion of his lisinopril hydrochlorothiazide which is 12.5 mg.  Continue with lisinopril 20 mg once a day.  Next step would be to pull off his lisinopril.  I reviewed his daily log of blood pressures.  There are some that are quite low.  He also was likely  exacerbating this by playing golf, likely dehydration.  His country ham seem to help with this. -He was worried about his pulse of going a little bit faster.  It has been quite slow for several years.  We will continue with metoprolol.  Coronary artery disease -Last PCI was performed in 2013.  DES placement to distal right coronary.  First PCI was in 2009-has an occluded circumflex with left to left collaterals.  Stable no angina.  On 90 of isosorbide. -This could be another area where we could pull back on to help with blood pressure.  Chronic kidney disease stage IIIa -Dr. Harrington Challenger has been following.  Avoiding NSAIDs.  Hyperlipidemia -Continue with Crestor 40 mg once a day.  No changes made.  On Zetia as well.  Mildly elevated LFTs -ALT has been between 49 and 58 chronically.  Essential hypertension -As above, periodic hypotension.     Medication Adjustments/Labs and Tests Ordered: Current medicines are reviewed at length with the patient today.  Concerns regarding medicines are outlined above.  Orders Placed This Encounter  Procedures  . EKG 12-Lead   Meds ordered this encounter  Medications  . lisinopril (ZESTRIL) 20 MG tablet    Sig: Take 1 tablet (20 mg total) by mouth daily.    Dispense:  90 tablet    Refill:  3    Patient Instructions  Medication Instructions:  Please discontinue your Lisinopril/HCTZ and start Lisinopril 20 mg once a day.  Continue all other medications as listed.  *If you need a refill on your cardiac medications before your next appointment, please call your pharmacy*  Follow-Up: At [facility], you and your health needs are  our priority.  As part of our continuing mission to provide you with exceptional heart care, we have created designated Provider Care Teams.  These Care Teams include your primary Cardiologist (physician) and Advanced Practice Providers (APPs -  Physician Assistants and Nurse Practitioners) who all work together to provide you with the care you need, when you need it.  We recommend signing up for the patient portal called "MyChart".  Sign up information is provided on this After Visit Summary.  MyChart is used to connect with patients for Virtual Visits (Telemedicine).  Patients are able to view lab/test results, encounter notes, upcoming appointments, etc.  Non-urgent messages can be sent to your provider as well.   To learn more about what you can do with MyChart, go to [email or web address].    Your next appointment:   2 month(s)  The format for your next appointment:   In Person  Provider:   You may see Candee Furbish, MD or one of the following Advanced Practice Providers on your designated Care Team:    Cecilie Kicks or Kathyrn Drown, NP   Thank you for choosing [facility]!!         Signed, Candee Furbish, MD  04/14/2020 3:21 PM    [place]

## 2020-04-14 NOTE — Patient Instructions (Signed)
Medication Instructions:  Please discontinue your Lisinopril/HCTZ and start Lisinopril 20 mg once a day.  Continue all other medications as listed.  *If you need a refill on your cardiac medications before your next appointment, please call your pharmacy*  Follow-Up: At [facility], you and your health needs are our priority.  As part of our continuing mission to provide you with exceptional heart care, we have created designated Provider Care Teams.  These Care Teams include your primary Cardiologist (physician) and Advanced Practice Providers (APPs -  Physician Assistants and Nurse Practitioners) who all work together to provide you with the care you need, when you need it.  We recommend signing up for the patient portal called "MyChart".  Sign up information is provided on this After Visit Summary.  MyChart is used to connect with patients for Virtual Visits (Telemedicine).  Patients are able to view lab/test results, encounter notes, upcoming appointments, etc.  Non-urgent messages can be sent to your provider as well.   To learn more about what you can do with MyChart, go to [email or web address].    Your next appointment:   2 month(s)  The format for your next appointment:   In Person  Provider:   You may see Candee Furbish, MD or one of the following Advanced Practice Providers on your designated Care Team:    Cecilie Kicks or Kathyrn Drown, NP   Thank you for choosing [facility]!!

## 2020-06-10 ENCOUNTER — Ambulatory Visit: Payer: Medicare HMO | Admitting: Cardiology

## 2020-06-29 NOTE — Progress Notes (Signed)
CARDIOLOGY OFFICE NOTE  Date:  07/06/2020    Thomas Farrell Date of Birth: 1941-09-09 Medical Record #[id number]  PCP:  Lawerance Cruel, MD  Cardiologist:  [facility]  Chief Complaint  Patient presents with  . Follow-up    Seen for Dr. Marlou Porch    History of Present Illness: Thomas Farrell is a 79 y.o. male who presents today for a 3 month check. Seen for Dr. Marlou Porch.   He has a history of HTN, known CAD with prior PCI, HTN, HLD and CKD.  Last PCI was performed in 2013 and had DES placement to distal right coronary.  First PCI was in 2009 -to an occluded circumflex with left to left collaterals. Has had some chronically elevated LFTs.   Last seen in May by Dr. Marlou Porch - BP low (also with ear infection) - he was symptomatic. Medicines were cut back with HCTZ being discontinued.   Comes in today. Here alone. Doing better. He notes his ear infection was slow to improve but finally resolved. BP has been fine. He feels better. No chest pain. His NTG is pretty old - he has not been using. He is still golfing once or twice a week - can only do about 9 holes due to back issues. He is happy with how he is doing.  He is due for his physical with Dr. Harrington Challenger next month with his labs. He has been vaccinated.   Past Medical History:  Diagnosis Date  . CAD in native artery    a. 2009 - s/p PTCA/DES to RCA with wire-induced dissection in distal RCA. b. 2013 - s/p DES to dRCA and cutting balloon angioplasty to ISR to mRCA  . Cancer of skin of temple    "right"  . Cardiomyopathy ([place])    a. EF 45-50% in 2009. b. 50-55% by echo 04/2016.  [name] CKD (chronic kidney disease), stage III   . GERD (gastroesophageal reflux disease)   . Hyperlipidemia   . Hypertension   . Myocardial infarction ([place]) 2009  . Obesity   . Osteoarthritis     Past Surgical History:  Procedure Laterality Date  . CARDIAC CATHETERIZATION  2009;  12/2011  . CORONARY ANGIOPLASTY WITH STENT PLACEMENT  2009; 01/2012   "~ 2wk  after each cath"  . INGUINAL HERNIA REPAIR Right ~ 2010  . PERCUTANEOUS CORONARY STENT INTERVENTION (PCI-S) N/A 01/25/2012   Procedure: PERCUTANEOUS CORONARY STENT INTERVENTION (PCI-S);  Surgeon: Jettie Booze, MD;  Location: [facility] CATH LAB;  Service: Cardiovascular;  Laterality: N/A;  . SKIN CANCER EXCISION Right    temple  . TONSILLECTOMY       Medications: Current Meds  Medication Sig  . clopidogrel (PLAVIX) 75 MG tablet Take 1 tablet (75 mg total) by mouth daily.  [name] co-enzyme Q-10 30 MG capsule Take 200 mg by mouth daily.  [name] ezetimibe (ZETIA) 10 MG tablet Take 1 tablet (10 mg total) by mouth daily.  . isosorbide mononitrate (IMDUR) 60 MG 24 hr tablet TAKE 1.5 TABLETS (90 MG TOTAL) BY MOUTH DAILY.  [name] lisinopril (ZESTRIL) 20 MG tablet Take 1 tablet (20 mg total) by mouth daily.  . metoprolol tartrate (LOPRESSOR) 50 MG tablet TAKE 1 TABLET (50 MG TOTAL) BY MOUTH 2 (TWO) TIMES DAILY.  . nitroGLYCERIN (NITROSTAT) 0.4 MG SL tablet Place 1 tablet (0.4 mg total) under the tongue every 5 (five) minutes as needed for chest pain (MAX 3 DOSES).  . pantoprazole (PROTONIX) 40 MG tablet Take 40  mg by mouth daily.  . rosuvastatin (CRESTOR) 40 MG tablet TAKE 1 TABLET (40 MG TOTAL) BY MOUTH DAILY.  . [DISCONTINUED] nitroGLYCERIN (NITROSTAT) 0.4 MG SL tablet Place 1 tablet (0.4 mg total) under the tongue every 5 (five) minutes as needed for chest pain (MAX 3 DOSES). Please keep upcoming appt in October with Dr. Marlou Porch before anymore refills. Thank you     Allergies: Allergies  Allergen Reactions  . Acetaminophen Diarrhea and Nausea And Vomiting  . Niacin Other (See Comments)    flushing    Social History: The patient  reports that he quit smoking about 18 years ago. His smoking use included cigarettes. He has a 172.00 pack-year smoking history. He has never used smokeless tobacco. He reports current alcohol use of about 14.0 standard drinks of alcohol per week. He reports that he does not use  drugs.   Family History: The patient's family history includes Heart attack in his brother, father, and paternal uncle; Hypertension in an other family member.   Review of Systems: Please see the history of present illness.   All other systems are reviewed and negative.   Physical Exam: VS:  BP 130/72   Pulse 65   Ht 5\' 8"  (1.727 m)   Wt 208 lb (94.3 kg)   SpO2 95%   BMI 31.63 kg/m  .  BMI Body mass index is 31.63 kg/m.  Wt Readings from Last 3 Encounters:  07/06/20 208 lb (94.3 kg)  04/14/20 207 lb (93.9 kg)  09/19/19 202 lb 3.2 oz (91.7 kg)    General: Pleasant. Elderly. Alert and in no acute distress.   Cardiac: Regular rate and rhythm. No murmurs, rubs, or gallops. No edema.  Respiratory:  Lungs are clear to auscultation bilaterally with normal work of breathing.  GI: Soft and nontender.  MS: No deformity or atrophy. Gait and ROM intact.  Skin: Warm and dry. Color is normal.  Neuro:  Strength and sensation are intact and no gross focal deficits noted.  Psych: Alert, appropriate and with normal affect.   LABORATORY DATA:  EKG:  EKG is not ordered today.    Lab Results  Component Value Date   WBC 9.5 04/20/2016   HGB 13.0 04/20/2016   HCT 40.1 04/20/2016   PLT 204 04/20/2016   GLUCOSE 111 (H) 04/20/2016   CHOL 131 11/22/2013   TRIG 94.0 11/22/2013   HDL 50.40 11/22/2013   LDLCALC 62 11/22/2013   ALT 43 11/22/2013   AST 29 11/22/2013   NA 136 04/20/2016   K 3.9 04/20/2016   CL 104 04/20/2016   CREATININE 1.34 (H) 04/20/2016   BUN 20 04/20/2016   CO2 24 04/20/2016   INR 1.00 01/25/2012       BNP (last 3 results) No results for input(s): BNP in the last 8760 hours.  ProBNP (last 3 results) No results for input(s): PROBNP in the last 8760 hours.   Other Studies Reviewed Today:  Echo Study Conclusions 2017   - Left ventricle: The cavity size was normal. There was mild focal  basal hypertrophy of the septum. Systolic function was normal.   The estimated ejection fraction was in the range of 50% to 55%.  Mild hypokinesis of the inferolateral and inferior myocardium.  - Aortic valve: Trileaflet; mildly thickened, mildly calcified  leaflets. Transvalvular velocity was within the normal range.  There was no stenosis. There was no regurgitation.  - Mitral valve: Transvalvular velocity was within the normal range.  There was no evidence  for stenosis. There was trivial  regurgitation.  - Left atrium: The atrium was moderately dilated.  - Right ventricle: The cavity size was normal. Wall thickness was  normal. Systolic function was normal.  - Atrial septum: No defect or patent foramen ovale was identified  by color flow Doppler.  - Tricuspid valve: There was trivial regurgitation.  - Pulmonary arteries: Systolic pressure was within the normal  range. PA peak pressure: 25 mm Hg (S).    Myoview 2017     There was no ST segment deviation noted during stress.  There is a medium defect of moderate severity present in the basal inferior, mid inferior and apical inferior location. 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 No ischemia.  There is a medium defect of severe severity present in the basal inferolateral, mid inferolateral and apical lateral location. 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 No ischemia.  The left ventricular ejection fraction is moderately decreased (30-44%).  Nuclear stress EF: 39%. There is severe hypo to akinesis of the inferior and inferolateral walls c/w prior infarct.  This is a low risk study.    Addended by Sueanne Margarita, MD on 04/21/2016 5:06 PM     There was no ST segment deviation noted during stress.  There is a medium defect of moderate severity present in the basal inferior, mid inferior and apical inferior location. 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 No ischemia.  There is a medium defect  of severe severity present in the basal inferolateral, mid inferolateral and apical lateral location. 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 No ischemia.  The left ventricular ejection fraction is moderately decreased (30-44%).  Nuclear stress EF: 39%. There is severe hypo to akinesis of the inferior and inferolateral walls c/w prior infarct.  This is a low risk study.         CARDIAC CATH IMPRESSIONS 01/2012:  1. Previously placed DES to prox/mid RCA has approximately 30-40% distal in stent stenosis. The proximal section of the RCA is also diffusely calcified with a focal up to 70% lesion which seems to be worsened from prior cath. Distal to the stent, there is a focal 90%, napkin ring lesion as well. This appears to be worse from prior cath.  2. Occluded circumflex with left to left collaterals. No change. 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 No change.  3. Ejection fraction appears normal at 55% with basal inferior wall hypokinesis. There is no gradient across the aortic valve.  RECOMMENDATION:  I will discuss films with Dr.Varanasi. His symptoms seem to have improved however new lesions appear in the right coronary artery. We will contemplate percutaneous intervention. I have discussed with patient.  IMPRESSIONS:  1.  Successful drug-eluting stent placement to the distal right coronary artery with a 3.0 x 20 mm Promus element stent, postdilated to 3.4 mm in diameter.   2.   Successful cutting balloon angioplasty of in-stent restenosis in the mid right coronary artery after intravascular ultrasound showed significant narrowing.  Initial 70% stenosis reduced to 0.  3.   Moderate stenosis with cross-sectional area by intravascular ultrasound of greater than 4 mm, more proximal to the mid vessel stents.  This area has 360 circumferential calcium and would require rotational atherectomy to treat, if needed in the future.    RECOMMENDATION:  Watch the patient overnight.  We'll start the patient on IV Integrilin since we did balloon inflations in the mid right  coronary artery.  Continue aspirin Plavix for at least a year without interruption.  If he does need repeat intervention to that mid right coronary artery, would have to use rotational atherectomy to treat the more proximal areas in an attempt to be able to deliver equipment and deployed stents well.     ASSESSMENT & PLAN:    1. CAD - no worrisome symptoms - managed medically. NTG refilled today.   2. HTN with recent hypotension - this has resolved with discontinuation of his HCTZ. No changes made today.   3. HLD - on chronic statin and Zetia therapy - lab next month by PCP  4. Chronically elevated LFTs - per PCP  5. CKD  Current medicines are reviewed with the patient today.  The patient does not have concerns regarding medicines other than what has been noted above.  The following changes have been made:  See above.  Labs/ tests ordered today include:   No orders of the defined types were placed in this encounter.    Disposition:   FU with Dr. Marlou Porch later this fall per recall.    Patient is agreeable to this plan and will call if any problems develop in the interim.   SignedTruitt Merle, NP  07/06/2020 12:15 PM  [facility] [address] [place], [place]  [address] Phone: [phone] Fax: [phone]

## 2020-07-06 ENCOUNTER — Ambulatory Visit: Payer: Medicare HMO | Admitting: Nurse Practitioner

## 2020-07-06 ENCOUNTER — Encounter: Payer: Self-pay | Admitting: Nurse Practitioner

## 2020-07-06 ENCOUNTER — Other Ambulatory Visit: Payer: Self-pay

## 2020-07-06 VITALS — BP 130/72 | HR 65 | Ht 68.0 in | Wt 208.0 lb

## 2020-07-06 DIAGNOSIS — I2510 Atherosclerotic heart disease of native coronary artery without angina pectoris: Secondary | ICD-10-CM

## 2020-07-06 DIAGNOSIS — I10 Essential (primary) hypertension: Secondary | ICD-10-CM

## 2020-07-06 DIAGNOSIS — E7800 Pure hypercholesterolemia, unspecified: Secondary | ICD-10-CM

## 2020-07-06 MED ORDER — NITROGLYCERIN 0.4 MG SL SUBL: 0.4000 mg | SUBLINGUAL_TABLET | SUBLINGUAL | 3 refills | Status: AC | PRN

## 2020-07-06 NOTE — Patient Instructions (Signed)
After Visit Summary:  We will be checking the following labs today - NONE   Medication Instructions:    Continue with your current medicines.    If you need a refill on your cardiac medications before your next appointment, please call your pharmacy.     Testing/Procedures To Be Arranged:  N/A  Follow-Up:   See Dr. Marlou Porch per recall in October.     At [facility], you and your health needs are our priority.  As part of our continuing mission to provide you with exceptional heart care, we have created designated Provider Care Teams.  These Care Teams include your primary Cardiologist (physician) and Advanced Practice Providers (APPs -  Physician Assistants and Nurse Practitioners) who all work together to provide you with the care you need, when you need it.  Special Instructions:  . Stay safe, wash your hands for at least 20 seconds and wear a mask when needed.  . It was good to talk with you today.    Call the [place] office at [phone] if you have any questions, problems or concerns.

## 2020-08-28 ENCOUNTER — Other Ambulatory Visit: Payer: Self-pay | Admitting: Cardiology

## 2020-09-07 ENCOUNTER — Other Ambulatory Visit: Payer: Self-pay | Admitting: Orthopedic Surgery

## 2020-09-07 DIAGNOSIS — M545 Low back pain, unspecified: Secondary | ICD-10-CM

## 2020-09-08 ENCOUNTER — Other Ambulatory Visit: Payer: Self-pay | Admitting: Orthopedic Surgery

## 2020-09-08 DIAGNOSIS — M545 Low back pain, unspecified: Secondary | ICD-10-CM

## 2020-09-21 ENCOUNTER — Encounter: Payer: Self-pay | Admitting: Cardiology

## 2020-09-21 ENCOUNTER — Other Ambulatory Visit: Payer: Self-pay

## 2020-09-21 ENCOUNTER — Ambulatory Visit: Payer: Medicare HMO | Admitting: Cardiology

## 2020-09-21 VITALS — BP 152/90 | HR 69 | Ht 67.0 in | Wt 214.2 lb

## 2020-09-21 DIAGNOSIS — I10 Essential (primary) hypertension: Secondary | ICD-10-CM | POA: Diagnosis not present

## 2020-09-21 DIAGNOSIS — E7800 Pure hypercholesterolemia, unspecified: Secondary | ICD-10-CM | POA: Diagnosis not present

## 2020-09-21 DIAGNOSIS — I2510 Atherosclerotic heart disease of native coronary artery without angina pectoris: Secondary | ICD-10-CM | POA: Diagnosis not present

## 2020-09-21 NOTE — Patient Instructions (Signed)
Medication Instructions:  The current medical regimen is effective;  continue present plan and medications.  *If you need a refill on your cardiac medications before your next appointment, please call your pharmacy*  Follow-Up: At [facility], you and your health needs are our priority.  As part of our continuing mission to provide you with exceptional heart care, we have created designated Provider Care Teams.  These Care Teams include your primary Cardiologist (physician) and Advanced Practice Providers (APPs -  Physician Assistants and Nurse Practitioners) who all work together to provide you with the care you need, when you need it.  We recommend signing up for the patient portal called "MyChart".  Sign up information is provided on this After Visit Summary.  MyChart is used to connect with patients for Virtual Visits (Telemedicine).  Patients are able to view lab/test results, encounter notes, upcoming appointments, etc.  Non-urgent messages can be sent to your provider as well.   To learn more about what you can do with MyChart, go to [email or web address].    Your next appointment:   12 month(s)  The format for your next appointment:   In Person  Provider:   Mark Skains, MD   Thank you for choosing [facility]!!      

## 2020-09-21 NOTE — Progress Notes (Signed)
Cardiology Office Note:    Date:  09/21/2020   ID:  Thomas Farrell, DOB 06-13-1941, MRN [id number]  PCP:  Lawerance Cruel, MD  [facility] Cardiologist:  Candee Furbish, MD  [facility] Electrophysiologist:  None   Referring MD: Lawerance Cruel, MD     History of Present Illness:    Thomas Farrell is a 79 y.o. male here for the follow-up of hypertension, coronary artery disease.  Previously felt too weak to play golf, felt like he was going to fall over.  Blood pressures were soft, in the 60s.  Ate some additional salt and felt better.  Had an inner ear infection at that time.  Previously had drug-eluting stent to the RCA 2009, 2013  Denies any fevers chills nausea vomiting syncope bleeding  Past Medical History:  Diagnosis Date  . CAD in native artery    a. 2009 - s/p PTCA/DES to RCA with wire-induced dissection in distal RCA. b. 2013 - s/p DES to dRCA and cutting balloon angioplasty to ISR to mRCA  . Cancer of skin of temple    "right"  . Cardiomyopathy ([place])    a. EF 45-50% in 2009. b. 50-55% by echo 04/2016.  [name] CKD (chronic kidney disease), stage III ([place])   . GERD (gastroesophageal reflux disease)   . Hyperlipidemia   . Hypertension   . Myocardial infarction ([place]) 2009  . Obesity   . Osteoarthritis     Past Surgical History:  Procedure Laterality Date  . CARDIAC CATHETERIZATION  2009;  12/2011  . CORONARY ANGIOPLASTY WITH STENT PLACEMENT  2009; 01/2012   "~ 2wk after each cath"  . INGUINAL HERNIA REPAIR Right ~ 2010  . PERCUTANEOUS CORONARY STENT INTERVENTION (PCI-S) N/A 01/25/2012   Procedure: PERCUTANEOUS CORONARY STENT INTERVENTION (PCI-S);  Surgeon: Jettie Booze, MD;  Location: [facility] CATH LAB;  Service: Cardiovascular;  Laterality: N/A;  . SKIN CANCER EXCISION Right    temple  . TONSILLECTOMY      Current Medications: Current Meds  Medication Sig  . clopidogrel (PLAVIX) 75 MG tablet Take 1 tablet (75 mg total) by mouth daily.  [name] co-enzyme  Q-10 30 MG capsule Take 200 mg by mouth daily.  [name] ezetimibe (ZETIA) 10 MG tablet TAKE ONE TABLET BY MOUTH EVERY DAY  . isosorbide mononitrate (IMDUR) 60 MG 24 hr tablet TAKE 1 AND 1/2 TABLETS BY MOUTH DAILY  . lisinopril (ZESTRIL) 20 MG tablet Take 1 tablet (20 mg total) by mouth daily.  . metoprolol tartrate (LOPRESSOR) 50 MG tablet TAKE ONE TABLET BY MOUTH TWICE DAILY  . nitroGLYCERIN (NITROSTAT) 0.4 MG SL tablet Place 1 tablet (0.4 mg total) under the tongue every 5 (five) minutes as needed for chest pain (MAX 3 DOSES).  . pantoprazole (PROTONIX) 40 MG tablet Take 40 mg by mouth daily.  . rosuvastatin (CRESTOR) 40 MG tablet TAKE ONE TABLET BY MOUTH ONCE DAILY     Allergies:   Acetaminophen and Niacin   Social History   Socioeconomic History  . Marital status: Married    Spouse name: Not on file  . Number of children: Not on file  . Years of education: Not on file  . Highest education level: Not on file  Occupational History  . Not on file  Tobacco Use  . Smoking status: Former Smoker    Packs/day: 4.00    Years: 43.00    Pack years: 172.00    Types: Cigarettes    Quit date: 01/24/2002  Years since quitting: 18.6  . Smokeless tobacco: Never Used  Vaping Use  . Vaping Use: Never used  Substance and Sexual Activity  . Alcohol use: Yes    Alcohol/week: 14.0 standard drinks    Types: 14 Glasses of wine per week  . Drug use: No  . Sexual activity: Not Currently  Other Topics Concern  . Not on file  Social History Narrative  . Not on file   Social Determinants of Health   Financial Resource Strain:   . Difficulty of Paying Living Expenses: Not on file  Food Insecurity:   . Worried About Charity fundraiser in the Last Year: Not on file  . Ran Out of Food in the Last Year: Not on file  Transportation Needs:   . Lack of Transportation (Medical): Not on file  . Lack of Transportation (Non-Medical): Not on file  Physical Activity:   . Days of Exercise per Week: Not on  file  . Minutes of Exercise per Session: Not on file  Stress:   . Feeling of Stress : Not on file  Social Connections:   . Frequency of Communication with Friends and Family: Not on file  . Frequency of Social Gatherings with Friends and Family: Not on file  . Attends Religious Services: Not on file  . Active Member of Clubs or Organizations: Not on file  . Attends Archivist Meetings: Not on file  . Marital Status: Not on file     Family History: The patient's family history includes Heart attack in his brother, father, and paternal uncle; Hypertension in an other family member. There is no history of Stroke.  ROS:   Please see the history of present illness.     All other systems reviewed and are negative.  EKGs/Labs/Other Studies Reviewed:    The following studies were reviewed today: Echocardiogram 05/11/2016:  - Left ventricle: The cavity size was normal. There was mild focal  basal hypertrophy of the septum. Systolic function was normal.  The estimated ejection fraction was in the range of 50% to 55%.  Mild hypokinesis of the inferolateral and inferior myocardium.  - Aortic valve: Trileaflet; mildly thickened, mildly calcified  leaflets. Transvalvular velocity was within the normal range.  There was no stenosis. There was no regurgitation.  - Mitral valve: Transvalvular velocity was within the normal range.  There was no evidence for stenosis. There was trivial  regurgitation.  - Left atrium: The atrium was moderately dilated.  - Right ventricle: The cavity size was normal. Wall thickness was  normal. Systolic function was normal.  - Atrial septum: No defect or patent foramen ovale was identified  by color flow Doppler.  - Tricuspid valve: There was trivial regurgitation.  - Pulmonary arteries: Systolic pressure was within the normal  range. PA peak pressure: 25 mm Hg (S).   EKG: Prior EKG on 04/14/2020-sinus rhythm.  Recent Labs: No  results found for requested labs within last 8760 hours.  Recent Lipid Panel    Component Value Date/Time   CHOL 131 11/22/2013 1332   TRIG 94.0 11/22/2013 1332   HDL 50.40 11/22/2013 1332   CHOLHDL 3 11/22/2013 1332   VLDL 18.8 11/22/2013 1332   LDLCALC 62 11/22/2013 1332     Risk Assessment/Calculations:       Physical Exam:    VS:  BP (!) 152/90   Pulse 69   Ht 5\' 7"  (1.702 m)   Wt 214 lb 3.2 oz (97.2 kg)  SpO2 98%   BMI 33.55 kg/m     Wt Readings from Last 3 Encounters:  09/21/20 214 lb 3.2 oz (97.2 kg)  07/06/20 208 lb (94.3 kg)  04/14/20 207 lb (93.9 kg)     GEN:  Well nourished, well developed in no acute distress HEENT: Normal NECK: No JVD; No carotid bruits LYMPHATICS: No lymphadenopathy CARDIAC: RRR, no murmurs, rubs, gallops RESPIRATORY:  Clear to auscultation without rales, wheezing or rhonchi  ABDOMEN: Soft, non-tender, non-distended MUSCULOSKELETAL:  No edema; No deformity  SKIN: Warm and dry NEUROLOGIC:  Alert and oriented x 3 PSYCHIATRIC:  Normal affect   ASSESSMENT:    1. Atherosclerosis of native coronary artery of native heart without angina pectoris   2. Essential hypertension   3. Pure hypercholesterolemia    PLAN:    In order of problems listed above:  Coronary artery disease -Prior stents reviewed from cardiac catheterization as above.  Continue with medical management.  Has nitroglycerin if needed for anginal symptoms. Metoprolol for angina.  -On Plavix monotherapy.   Essential hypertension -Has had bouts of hypotension.  Blood pressure today 152/90.  Much improved.  Continuing with lisinopril isosorbide prescription drug management. Stopped HCTZ prior because of super low BP.   Hyperlipidemia -On Crestor as well as Zetia.  LDL 57 in the past.  Excellent.  Hemoglobin 14.1 creatinine 0.9 ALT 39 within normal range.  Outside labs.  Excellent job.  Previously elevated LFTs -ALT 39 at last check, upper normal.  Excellent.  Back  pain --getting MRI, hampering his ability to exercise play golf.  Weight has increased.  Ear pain/headache -Seeing Dr. Harrington Challenger today.  Has had bouts with ear infection in the past.  If he needs to take a few days of aspirin this would be reasonable.    Medication Adjustments/Labs and Tests Ordered: Current medicines are reviewed at length with the patient today.  Concerns regarding medicines are outlined above.  No orders of the defined types were placed in this encounter.  No orders of the defined types were placed in this encounter.   Patient Instructions  Medication Instructions:  The current medical regimen is effective;  continue present plan and medications.  *If you need a refill on your cardiac medications before your next appointment, please call your pharmacy*  Follow-Up: At [facility], you and your health needs are our priority.  As part of our continuing mission to provide you with exceptional heart care, we have created designated Provider Care Teams.  These Care Teams include your primary Cardiologist (physician) and Advanced Practice Providers (APPs -  Physician Assistants and Nurse Practitioners) who all work together to provide you with the care you need, when you need it.  We recommend signing up for the patient portal called "MyChart".  Sign up information is provided on this After Visit Summary.  MyChart is used to connect with patients for Virtual Visits (Telemedicine).  Patients are able to view lab/test results, encounter notes, upcoming appointments, etc.  Non-urgent messages can be sent to your provider as well.   To learn more about what you can do with MyChart, go to [email or web address].    Your next appointment:   12 month(s)  The format for your next appointment:   In Person  Provider:   Candee Furbish, MD   Thank you for choosing [facility]!!        Signed, Candee Furbish, MD  09/21/2020 11:38 AM    [place]

## 2020-09-23 ENCOUNTER — Other Ambulatory Visit: Payer: Self-pay | Admitting: Family Medicine

## 2020-09-23 DIAGNOSIS — R519 Headache, unspecified: Secondary | ICD-10-CM

## 2020-09-24 ENCOUNTER — Other Ambulatory Visit: Payer: Self-pay

## 2020-09-24 ENCOUNTER — Ambulatory Visit
Admission: RE | Admit: 2020-09-24 | Discharge: 2020-09-24 | Disposition: A | Discharge status: Home / Self Care | Payer: Medicare HMO | Source: Ambulatory Visit | Attending: Family Medicine | Admitting: Family Medicine

## 2020-09-24 DIAGNOSIS — R519 Headache, unspecified: Secondary | ICD-10-CM

## 2020-09-27 ENCOUNTER — Ambulatory Visit
Admission: RE | Admit: 2020-09-27 | Discharge: 2020-09-27 | Disposition: A | Discharge status: Home / Self Care | Payer: Medicare HMO | Source: Ambulatory Visit | Attending: Orthopedic Surgery | Admitting: Orthopedic Surgery

## 2020-09-27 DIAGNOSIS — M545 Low back pain, unspecified: Secondary | ICD-10-CM

## 2020-11-26 ENCOUNTER — Other Ambulatory Visit: Payer: Self-pay | Admitting: Cardiology

## 2020-11-27 DIAGNOSIS — I2510 Atherosclerotic heart disease of native coronary artery without angina pectoris: Secondary | ICD-10-CM | POA: Diagnosis not present

## 2020-11-27 DIAGNOSIS — K219 Gastro-esophageal reflux disease without esophagitis: Secondary | ICD-10-CM | POA: Diagnosis not present

## 2020-11-27 DIAGNOSIS — C679 Malignant neoplasm of bladder, unspecified: Secondary | ICD-10-CM | POA: Diagnosis not present

## 2020-11-27 DIAGNOSIS — I10 Essential (primary) hypertension: Secondary | ICD-10-CM | POA: Diagnosis not present

## 2020-11-27 DIAGNOSIS — E782 Mixed hyperlipidemia: Secondary | ICD-10-CM | POA: Diagnosis not present

## 2020-12-01 DIAGNOSIS — I10 Essential (primary) hypertension: Secondary | ICD-10-CM | POA: Diagnosis not present

## 2020-12-01 DIAGNOSIS — E7800 Pure hypercholesterolemia, unspecified: Secondary | ICD-10-CM | POA: Diagnosis not present

## 2020-12-01 DIAGNOSIS — H524 Presbyopia: Secondary | ICD-10-CM | POA: Diagnosis not present

## 2020-12-09 DIAGNOSIS — L570 Actinic keratosis: Secondary | ICD-10-CM | POA: Diagnosis not present

## 2020-12-23 DIAGNOSIS — C679 Malignant neoplasm of bladder, unspecified: Secondary | ICD-10-CM | POA: Diagnosis not present

## 2020-12-29 DIAGNOSIS — H35033 Hypertensive retinopathy, bilateral: Secondary | ICD-10-CM | POA: Diagnosis not present

## 2021-01-05 DIAGNOSIS — I10 Essential (primary) hypertension: Secondary | ICD-10-CM | POA: Diagnosis not present

## 2021-01-05 DIAGNOSIS — H2513 Age-related nuclear cataract, bilateral: Secondary | ICD-10-CM | POA: Diagnosis not present

## 2021-01-05 DIAGNOSIS — H25043 Posterior subcapsular polar age-related cataract, bilateral: Secondary | ICD-10-CM | POA: Diagnosis not present

## 2021-01-05 DIAGNOSIS — H25013 Cortical age-related cataract, bilateral: Secondary | ICD-10-CM | POA: Diagnosis not present

## 2021-01-05 DIAGNOSIS — H2511 Age-related nuclear cataract, right eye: Secondary | ICD-10-CM | POA: Diagnosis not present

## 2021-01-11 ENCOUNTER — Telehealth: Payer: Self-pay | Admitting: Cardiovascular Disease

## 2021-01-11 NOTE — Telephone Encounter (Signed)
   Primary Cardiologist: Candee Furbish, MD  Chart reviewed as part of pre-operative protocol coverage. Cataract extractions are recognized in guidelines as low risk surgeries that do not typically require specific preoperative testing or holding of blood thinner therapy. Therefore, given past medical history and time since last visit, based on ACC/AHA guidelines, Thomas Farrell would be at acceptable risk for the planned procedure without further cardiovascular testing.   I will route this recommendation to the requesting party via Epic fax function and remove from pre-op pool.  Please call with questions.  Loel Dubonnet, NP 01/11/2021, 11:35 AM

## 2021-01-11 NOTE — Telephone Encounter (Signed)
   [facility] Pre-operative Risk Assessment     Request for surgical clearance:  1. What type of surgery is being performed? Cataract extraction with intraocular lens implantations of the right eye followed by the left eye   2. When is this surgery scheduled? 02/22/21 then 03/15/21   3. What type of clearance is required (medical clearance vs. Pharmacy clearance to hold med vs. Both)? Medical   4. Are there any medications that need to be held prior to surgery and how long? N/A  5. Practice name and name of physician performing surgery? Dr. Darleen Crocker with [facility]   6. What is the office phone number? [phone]   7.   What is the office fax number? [phone]  8.   Anesthesia type (None, local, MAC, general) ? Topical anesthesia with IV medication   Thomas Farrell 01/11/2021, 10:15 AM  _________________________________________________________________   (provider comments below)

## 2021-02-08 DIAGNOSIS — I25119 Atherosclerotic heart disease of native coronary artery with unspecified angina pectoris: Secondary | ICD-10-CM | POA: Diagnosis not present

## 2021-02-08 DIAGNOSIS — I2510 Atherosclerotic heart disease of native coronary artery without angina pectoris: Secondary | ICD-10-CM | POA: Diagnosis not present

## 2021-02-08 DIAGNOSIS — N529 Male erectile dysfunction, unspecified: Secondary | ICD-10-CM | POA: Diagnosis not present

## 2021-02-08 DIAGNOSIS — K219 Gastro-esophageal reflux disease without esophagitis: Secondary | ICD-10-CM | POA: Diagnosis not present

## 2021-02-08 DIAGNOSIS — Z008 Encounter for other general examination: Secondary | ICD-10-CM | POA: Diagnosis not present

## 2021-02-08 DIAGNOSIS — Z6831 Body mass index (BMI) 31.0-31.9, adult: Secondary | ICD-10-CM | POA: Diagnosis not present

## 2021-02-08 DIAGNOSIS — E782 Mixed hyperlipidemia: Secondary | ICD-10-CM | POA: Diagnosis not present

## 2021-02-08 DIAGNOSIS — R32 Unspecified urinary incontinence: Secondary | ICD-10-CM | POA: Diagnosis not present

## 2021-02-08 DIAGNOSIS — Z7902 Long term (current) use of antithrombotics/antiplatelets: Secondary | ICD-10-CM | POA: Diagnosis not present

## 2021-02-08 DIAGNOSIS — I10 Essential (primary) hypertension: Secondary | ICD-10-CM | POA: Diagnosis not present

## 2021-02-08 DIAGNOSIS — I252 Old myocardial infarction: Secondary | ICD-10-CM | POA: Diagnosis not present

## 2021-02-08 DIAGNOSIS — E785 Hyperlipidemia, unspecified: Secondary | ICD-10-CM | POA: Diagnosis not present

## 2021-02-08 DIAGNOSIS — E669 Obesity, unspecified: Secondary | ICD-10-CM | POA: Diagnosis not present

## 2021-02-22 ENCOUNTER — Other Ambulatory Visit: Payer: Self-pay | Admitting: Cardiology

## 2021-02-22 DIAGNOSIS — H2511 Age-related nuclear cataract, right eye: Secondary | ICD-10-CM | POA: Diagnosis not present

## 2021-02-22 DIAGNOSIS — H2513 Age-related nuclear cataract, bilateral: Secondary | ICD-10-CM | POA: Diagnosis not present

## 2021-02-23 DIAGNOSIS — H2512 Age-related nuclear cataract, left eye: Secondary | ICD-10-CM | POA: Diagnosis not present

## 2021-02-24 DIAGNOSIS — E782 Mixed hyperlipidemia: Secondary | ICD-10-CM | POA: Diagnosis not present

## 2021-02-24 DIAGNOSIS — K219 Gastro-esophageal reflux disease without esophagitis: Secondary | ICD-10-CM | POA: Diagnosis not present

## 2021-02-24 DIAGNOSIS — I2510 Atherosclerotic heart disease of native coronary artery without angina pectoris: Secondary | ICD-10-CM | POA: Diagnosis not present

## 2021-02-24 DIAGNOSIS — I10 Essential (primary) hypertension: Secondary | ICD-10-CM | POA: Diagnosis not present

## 2021-03-15 DIAGNOSIS — H2512 Age-related nuclear cataract, left eye: Secondary | ICD-10-CM | POA: Diagnosis not present

## 2021-03-15 DIAGNOSIS — H2513 Age-related nuclear cataract, bilateral: Secondary | ICD-10-CM | POA: Diagnosis not present

## 2021-03-15 DIAGNOSIS — H2511 Age-related nuclear cataract, right eye: Secondary | ICD-10-CM | POA: Diagnosis not present

## 2021-03-19 ENCOUNTER — Telehealth: Payer: Self-pay | Admitting: Cardiology

## 2021-03-19 NOTE — Telephone Encounter (Signed)
Agree with plan Fielding Mault, MD  

## 2021-03-19 NOTE — Telephone Encounter (Signed)
Spoke with pt and advised per pharmacist recommendation increase Lisinopril to 40mg  - 2 (20mg ) tablet by mouth daily and continue to monitor BP 2 hours after taking morning BP meds.  Pt verbalizes understanding and is agreeable to HTN clinic appt on 04/02/2021 at 11am.

## 2021-03-19 NOTE — Telephone Encounter (Signed)
Spoke with pt who states he had an appointment with the dentist this am.  BP was elevated at 201/103.  Pt states he had cataract surgery last week and his BP was elevated then as well.  Current  BP 174/92.  Pt reports he took his BP meds around 6am today and that he is compliant daily with medications. He also reports a mild HA today that has resolved after taking ASA 325mg  - 2 tablets.  Pt denies CP, SOB weakness or edema.  Pt states he will have to have to have clearance from Dr Marlou Porch to see his dentist again and will drop off the form next week.  Pt is due to be seen in the office again 08/2021.  Pt advised Dr Marlou Porch is not in the office today but will forward information to him and our pharmacy team for review and recommendation.  Pt advised to continue to monitor BP  and keep a daily log as well as monitor sodium intake.  Reviewed ED precautions.  Pt verbalizes understanding and agrees with current plan.

## 2021-03-19 NOTE — Telephone Encounter (Signed)
Pt c/o BP issue: STAT if pt c/o blurred vision, one-sided weakness or slurred speech  1. What are your last 5 BP readings? 201/103, 183/88  2. Are you having any other symptoms (ex. Dizziness, headache, blurred vision, passed out)? Slight headache  3. What is your BP issue? Blood pressure is running high

## 2021-03-19 NOTE — Telephone Encounter (Signed)
Attempted phone call to pt.  Left voicemail message to contact RN at [phone]. 

## 2021-03-19 NOTE — Telephone Encounter (Signed)
Appointment scheduled.

## 2021-03-19 NOTE — Telephone Encounter (Signed)
Please have the patient increase his lisinopril to 40mg  daily (2 tablets) and see if he can come in for a HTN clinic visit on 5/13 at 11:00.

## 2021-04-02 ENCOUNTER — Other Ambulatory Visit: Payer: Self-pay

## 2021-04-02 ENCOUNTER — Ambulatory Visit (INDEPENDENT_AMBULATORY_CARE_PROVIDER_SITE_OTHER): Payer: Medicare HMO | Admitting: Pharmacist

## 2021-04-02 VITALS — BP 144/72 | HR 57

## 2021-04-02 DIAGNOSIS — I10 Essential (primary) hypertension: Secondary | ICD-10-CM

## 2021-04-02 MED ORDER — LISINOPRIL 40 MG PO TABS: 40.0000 mg | ORAL_TABLET | Freq: Every day | ORAL | 3 refills | Status: DC

## 2021-04-02 MED ORDER — AMLODIPINE BESYLATE 2.5 MG PO TABS: 2.5000 mg | ORAL_TABLET | Freq: Every day | ORAL | 3 refills | Status: DC

## 2021-04-02 NOTE — Progress Notes (Signed)
Patient ID: Thomas Farrell                 DOB: [birth year]                      MRN: [id number]     HPI: Thomas Farrell is a 80 y.o. male referred by Dr. Marlou Porch to HTN clinic. PMH is significant for HTN, CAD s/p sent to the RCA in 2009, 2013, HDL and elevated LFT's. He has had a history of low blood pressure in the past. Patient called the office 03/19/21 stating his BP at the dentist was 201/103. His lisinopril was increased to 40mg  daily and he was referred to the HTN clinic.  Patient presents today for follow up. He brings in a list of blood pressures over the last month. There are about 5 readings each day he check. Reading vary greatly from 130's-180's/70-90's. HR typically in the 50's. BP usually the highest in the AM. He does not take any NSAIDS or decongestants. Takes ASA for headache. He has had a mild headache daily. Denies dizziness, lightheadedness, blurred vision, SOB or swelling. Had not been riding his stantionary bike due to concerns about his blood pressure. He has arthritis in his back so he cannot walk for long. The bike does not bother him. Back pain is only with a lot of activity. He typically drinks a few vokda and tomato juice per night, but hasn't had an alcohol this week. Took HCTZ in the past, was taken off due to episode of low BP. Per patient this was only one episode. He has a [employer] upper arm cuff.  Current HTN meds: isosorbide mononitrate 90mg  daily, lisinopril 40mg  daily and metoprolol tartrate 50mg  twice a day Previously tried: HCTZ (stopped due to low BP) BP goal: <130/80  Family History: The patient's family history includes Heart attack in his brother, father, and paternal uncle; Hypertension in an other family member. There is no history of Stroke.  Social History: former smoker, ETOH 1 drink per night  Diet: eats 98% of food at home. Uses a little salt, eats a lot of fish and shrimp, grows his own vegetables, makes his own tomatoes juice, pork chops, occasional  steak, occasional baccon Dessert once a month Drink: ice tea (lightly sweetened), coffee (black) (regular) 3 glass of tea and 3 cups of coffee, lemonade  Exercise: rides bike, total gym   Home BP readings: varies great from 130's-180's/70-90 HR 50's-60's  Wt Readings from Last 3 Encounters:  09/21/20 214 lb 3.2 oz (97.2 kg)  07/06/20 208 lb (94.3 kg)  04/14/20 207 lb (93.9 kg)   BP Readings from Last 3 Encounters:  09/21/20 (!) 152/90  07/06/20 130/72  04/14/20 110/70   Pulse Readings from Last 3 Encounters:  09/21/20 69  07/06/20 65  04/14/20 68    Renal function: CrCl cannot be calculated (Patient's most recent lab result is older than the maximum 21 days allowed.).  Past Medical History:  Diagnosis Date  . CAD in native artery    a. 2009 - s/p PTCA/DES to RCA with wire-induced dissection in distal RCA. b. 2013 - s/p DES to dRCA and cutting balloon angioplasty to ISR to mRCA  . Cancer of skin of temple    "right"  . Cardiomyopathy ([place])    a. EF 45-50% in 2009. b. 50-55% by echo 04/2016.  [name] CKD (chronic kidney disease), stage III ([place])   . GERD (gastroesophageal reflux disease)   .  Hyperlipidemia   . Hypertension   . Myocardial infarction ([place]) 2009  . Obesity   . Osteoarthritis     Current Outpatient Medications on File Prior to Visit  Medication Sig Dispense Refill  . clopidogrel (PLAVIX) 75 MG tablet TAKE ONE TABLET BY MOUTH DAILY 90 tablet 3  . co-enzyme Q-10 30 MG capsule Take 200 mg by mouth daily.    [name] ezetimibe (ZETIA) 10 MG tablet TAKE ONE TABLET BY MOUTH EVERY DAY 90 tablet 3  . isosorbide mononitrate (IMDUR) 60 MG 24 hr tablet TAKE 1 AND 1/2 TABLETS BY MOUTH DAILY 135 tablet 2  . lisinopril (ZESTRIL) 20 MG tablet TAKE ONE TABLET BY MOUTH ONCE DAILY 90 tablet 2  . metoprolol tartrate (LOPRESSOR) 50 MG tablet TAKE ONE TABLET BY MOUTH TWICE DAILY 180 tablet 2  . nitroGLYCERIN (NITROSTAT) 0.4 MG SL tablet Place 1 tablet (0.4 mg total) under the tongue every  5 (five) minutes as needed for chest pain (MAX 3 DOSES). 25 tablet 3  . pantoprazole (PROTONIX) 40 MG tablet Take 40 mg by mouth daily.    . rosuvastatin (CRESTOR) 40 MG tablet TAKE ONE TABLET BY MOUTH ONCE DAILY 90 tablet 2   No current facility-administered medications on file prior to visit.    Allergies  Allergen Reactions  . Acetaminophen Diarrhea and Nausea And Vomiting  . Niacin Other (See Comments)    flushing    There were no vitals taken for this visit.   Assessment/Plan:  1. Hypertension - Blood pressure above goal of <130/80 in clinic today. He is trying to loose weight and we talked about diet pretty extensively today. Will add low dose amlodipine 2.5mg  daily. Check BMP today due to increased lisinopril dose. Continue isosorbide mononitrate 90mg  daily, lisinopril 40mg  daily and metoprolol tartrate 50mg  twice a day. I have encouraged him to increase his exercise- starting riding stationary bike again. Warned against juice consumption (even if its fruit or vegetable juice). I have also asked him to cut back on his caffeine to 1-2 cups per day. Check blood pressure just once a day and bring his log and meter with him to next appointment. Follow up in 3 weeks.  Dr. Marlou Porch signed form clearing his to have dental procedure. I faxed form to the dentist.   Thank you  Ramond Dial, Pharm.D, BCPS, CPP [place]  [address], [place], [place] [address]  Phone: [phone]; Fax: [phone]

## 2021-04-02 NOTE — Patient Instructions (Addendum)
Please start taking amlodipine 2.5mg  daily Continue taking isosorbide mononitrate 90mg  daily, lisinopril 40mg  daily and metoprolol tartrate 50mg  twice a day  Please cut back on coffee and tea- try for only 1 cup of each a day. Try drinking more water  Please increase your exercise

## 2021-04-03 LAB — BASIC METABOLIC PANEL
BUN/Creatinine Ratio: 14 (ref 10–24)
BUN: 15 mg/dL (ref 8–27)
CO2: 22 mmol/L (ref 20–29)
Calcium: 9.4 mg/dL (ref 8.6–10.2)
Chloride: 103 mmol/L (ref 96–106)
Creatinine, Ser: 1.04 mg/dL (ref 0.76–1.27)
Glucose: 104 mg/dL — ABNORMAL HIGH (ref 65–99)
Potassium: 4.7 mmol/L (ref 3.5–5.2)
Sodium: 141 mmol/L (ref 134–144)
eGFR: 73 mL/min/{1.73_m2} (ref >59)

## 2021-04-05 ENCOUNTER — Telehealth: Payer: Self-pay | Admitting: Pharmacist

## 2021-04-05 IMAGING — MR MR LUMBAR SPINE W/O CM
4 of 5 series · 27 of 48 positions shown · non-contrast
Comparison: None.

CLINICAL DATA: Low back pain with leg pain.

EXAM:
MRI LUMBAR SPINE WITHOUT CONTRAST
TECHNIQUE: Multiplanar, multisequence MR imaging of the lumbar spine was
performed. No intravenous contrast was administered.

[Series 2: T2 · sagittal · 4.0mm · 1.09mm/px · 6 of 17 slices shown (1 of 2)]
[im 1/17]
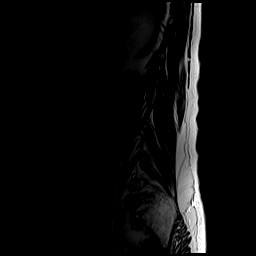
[im 4/17]
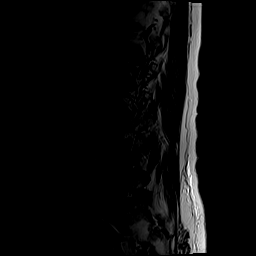
[im 7/17]
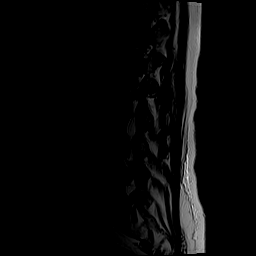
[im 10/17]
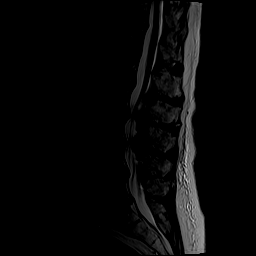
[im 13/17]
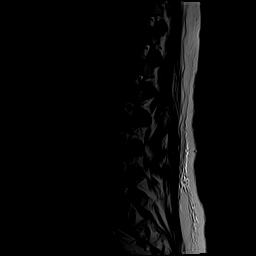
[im 17/17]
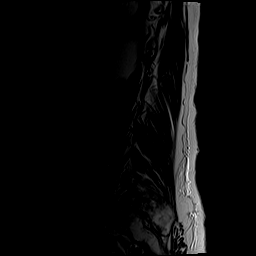

[Series 4: T1 · sagittal · 4.0mm · 1.09mm/px · 6 of 17 slices shown (1 of 2)]
[im 1/17]
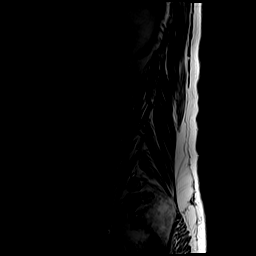
[im 4/17]
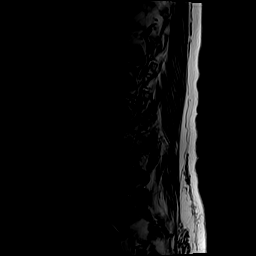
[im 7/17]
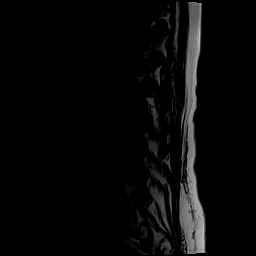
[im 10/17]
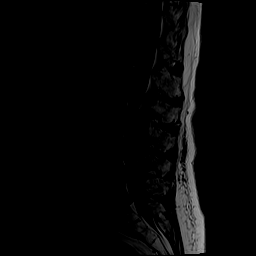
[im 13/17]
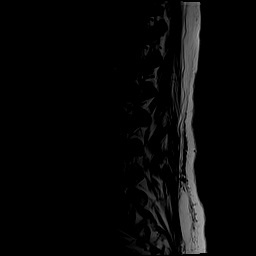
[im 17/17]
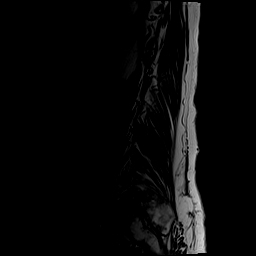

[Series 5: T2 · axial · 4.0mm · 0.39mm/px · z∈[-40,+162]mm · 9 of 42 slices shown (2 of 2)]
[im 1/42]
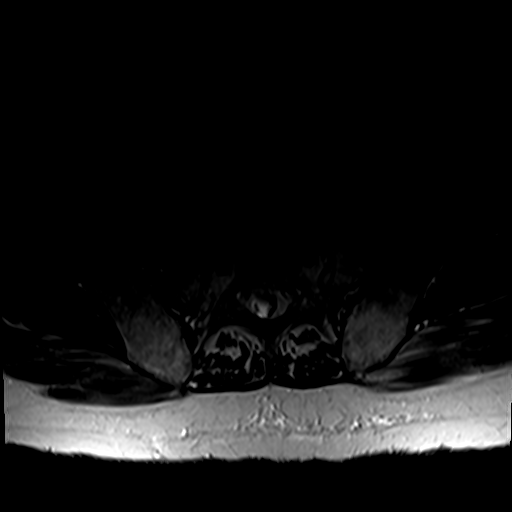
[im 6/42]
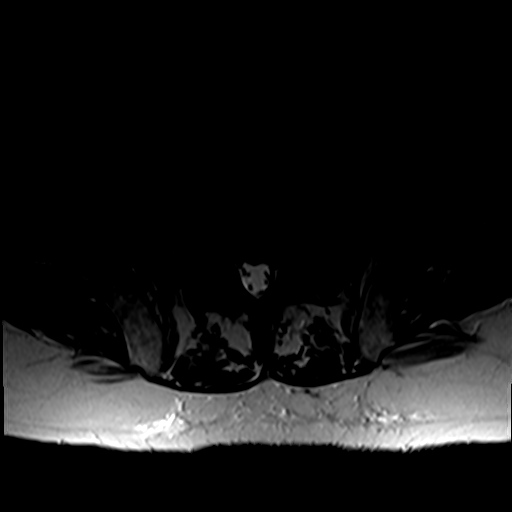
[im 12/42]
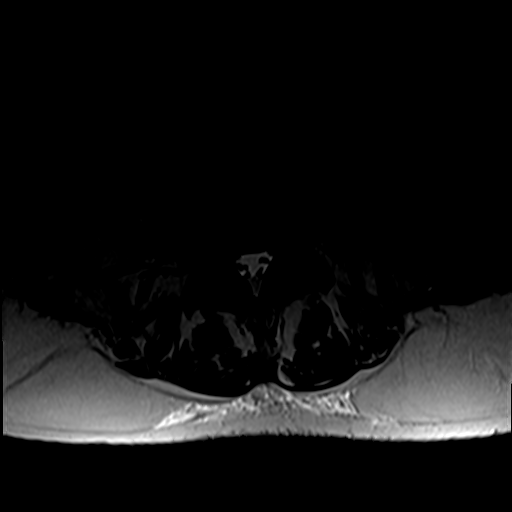
[im 18/42]
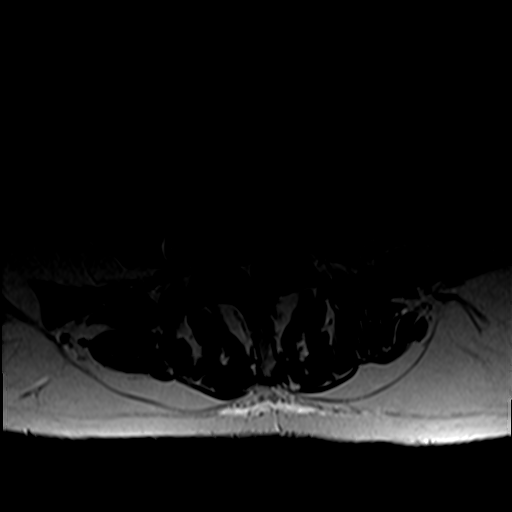
[im 21/42]
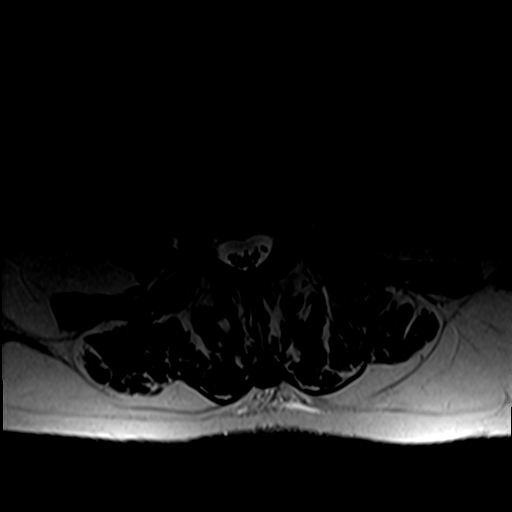
[im 24/42]
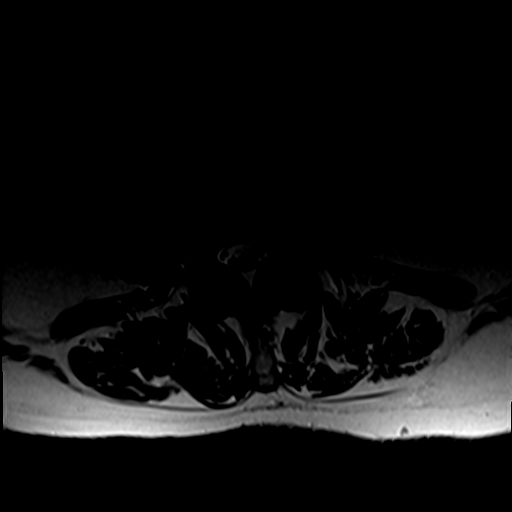
[im 30/42]
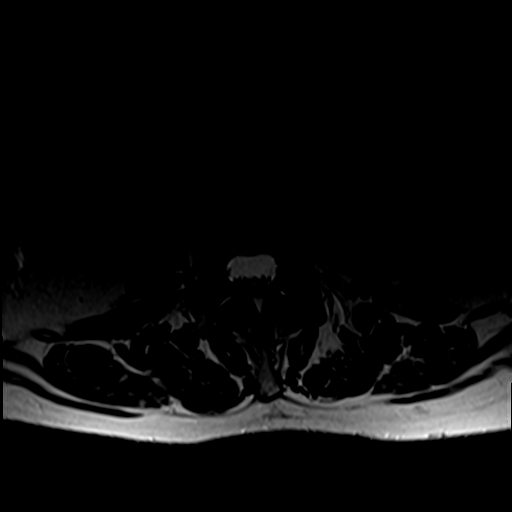
[im 36/42]
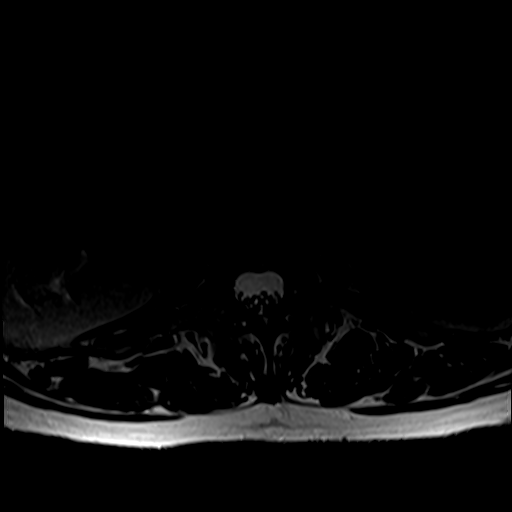
[im 42/42]
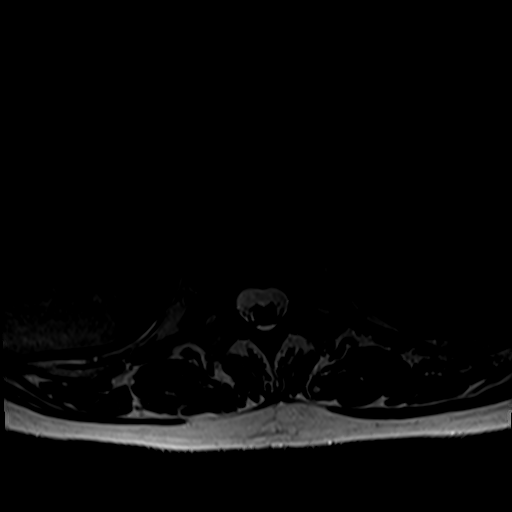

[Series 6: T1 · axial · 4.0mm · 0.39mm/px · z∈[-40,+133]mm · 6 of 42 slices shown (2 of 2)]
[im 1/42]
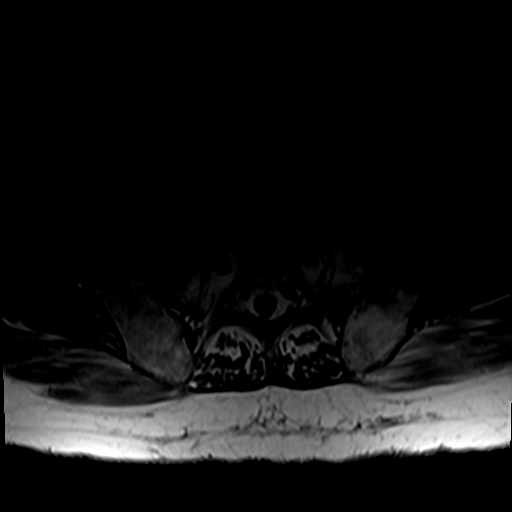
[im 6/42]
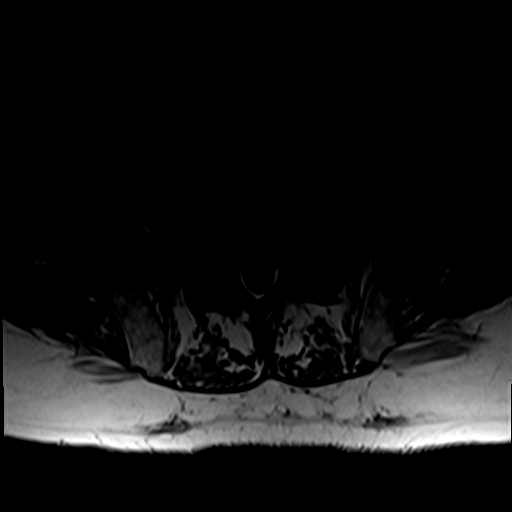
[im 12/42]
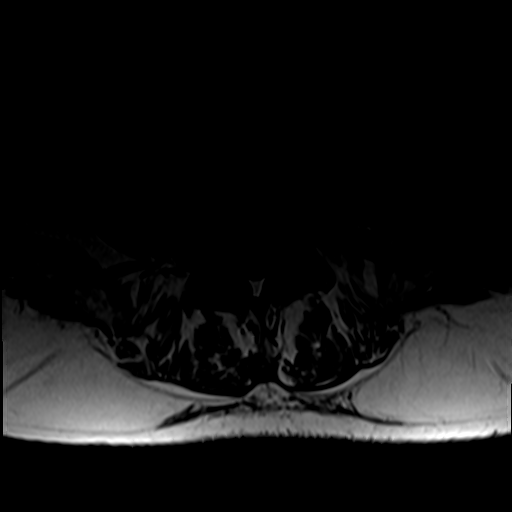
[im 18/42]
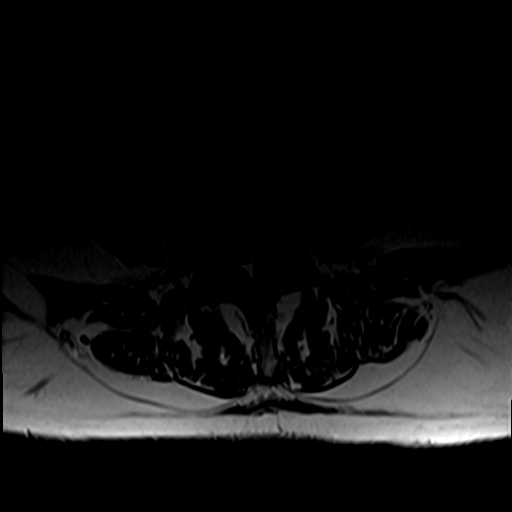
[im 21/42]
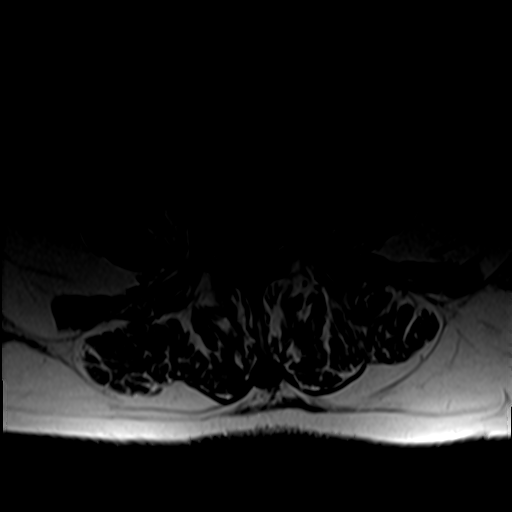
[im 36/42]
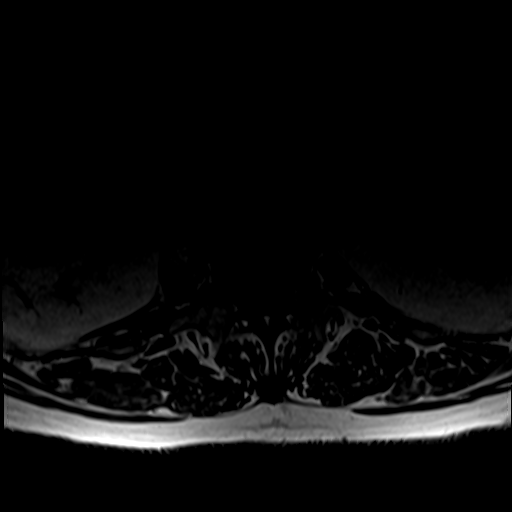

[27 of 48 positions shown; findings below may reference images not displayed]

FINDINGS: Segmentation:  Normal

Alignment:  Mild retrolisthesis L1-2, L2-3, L3-4, L4-5, L5-S1.

Vertebrae:  Normal bone marrow.  Negative for fracture or mass.

Conus medullaris and cauda equina: Conus extends to the L1 level.
Conus and cauda equina appear normal.

Paraspinal and other soft tissues: Negative for paraspinous mass or
adenopathy.

Disc levels:

L1-2: Mild disc bulging and mild facet degeneration. Negative for
stenosis

L2-3: Moderate disc degeneration with diffuse endplate spurring and
moderate facet and ligamentum flavum hypertrophy. Moderate spinal
stenosis and moderate subarticular stenosis bilaterally

L3-4: Disc degeneration with diffuse disc bulging and endplate
spurring. Mild facet and ligamentum flavum hypertrophy. Mild spinal
stenosis and moderate subarticular stenosis bilaterally.

L4-5: Disc degeneration with disc space narrowing. Asymmetric
osteophyte on the right. Large right foraminal and extraforaminal
osteophyte causing severe subarticular and foraminal stenosis on the
right. Bilateral facet hypertrophy. Moderate subarticular stenosis
on the left. Slight narrowing of the spinal canal

L5-S1: Disc degeneration with diffuse endplate spurring causing
moderate subarticular stenosis bilaterally.
IMPRESSION: Multilevel degenerative change in spurring throughout the lumbar
spine as above.

Negative for fracture

## 2021-04-05 NOTE — Telephone Encounter (Signed)
Called patient reviewed labs. No medication changes. Patient dates his BP today was 120/59.

## 2021-04-05 NOTE — Telephone Encounter (Signed)
BMP stable. Continue isosorbide mononitrate 90mg  daily, lisinopril 40mg  daily, metoprolol tartrate 50mg  twice a day and amlodipine 2.5mg  daily.  Called pt and LVM for him to call back.

## 2021-04-16 DIAGNOSIS — I10 Essential (primary) hypertension: Secondary | ICD-10-CM | POA: Diagnosis not present

## 2021-04-16 DIAGNOSIS — K219 Gastro-esophageal reflux disease without esophagitis: Secondary | ICD-10-CM | POA: Diagnosis not present

## 2021-04-16 DIAGNOSIS — I2510 Atherosclerotic heart disease of native coronary artery without angina pectoris: Secondary | ICD-10-CM | POA: Diagnosis not present

## 2021-04-16 DIAGNOSIS — E782 Mixed hyperlipidemia: Secondary | ICD-10-CM | POA: Diagnosis not present

## 2021-04-22 ENCOUNTER — Ambulatory Visit (INDEPENDENT_AMBULATORY_CARE_PROVIDER_SITE_OTHER): Payer: Medicare HMO | Admitting: Pharmacist

## 2021-04-22 ENCOUNTER — Other Ambulatory Visit: Payer: Self-pay

## 2021-04-22 VITALS — BP 140/70 | HR 73

## 2021-04-22 DIAGNOSIS — I10 Essential (primary) hypertension: Secondary | ICD-10-CM

## 2021-04-22 MED ORDER — AMLODIPINE BESYLATE 5 MG PO TABS: 5.0000 mg | ORAL_TABLET | Freq: Every day | ORAL | 3 refills | Status: DC

## 2021-04-22 NOTE — [place] Notes (Signed)
Patient ID: Thomas Farrell                 DOB: [birth year]                      MRN: [id number]     HPI: Thomas Farrell is a 80 y.o. male referred by Dr. Marlou Porch to HTN clinic. PMH is significant for HTN, CAD s/p sent to the RCA in 2009, 2013, HDL and elevated LFT's. He has had a history of low blood pressure in the past. Patient called the office 03/19/21 stating his BP at the dentist was 201/103. His lisinopril was increased to 40mg  daily and he was referred to the HTN clinic.  At last HTN clinic visit, patients blood pressure was 144/72. Amlodipine 2.5mg  daily was started. Repeat BMP showed stable renal function and electrolytes after increase in lisinopril. Patient presents today for follow up. He denies dizziness, lightheadedness, blurred vision, SOB or swelling. He states he wakes up with a slight headache. Blames it on sinuses. He was able to have his dental procedure. He started riding his stationary bike again 1 week ago and also switched to caffeine free coffee. His blood pressure at home varies greatly, 416-606'T systolic. He did bring in his blood pressure cuff. Office reading was 140/70- home BP cuff 124/69 therefore home cuff not accurate.   Current HTN meds: isosorbide mononitrate 90mg  daily, lisinopril 40mg  daily and metoprolol tartrate 50mg  twice a day, amlodipine 2.5mg  daily Previously tried: HCTZ (stopped due to low BP) BP goal: <130/80  Family History: The patient's family history includes Heart attack in his brother, father, and paternal uncle; Hypertension in an other family member. There is no history of Stroke.  Social History: former smoker, ETOH 1 drink per night  Diet: eats 98% of food at home. Uses a little salt, eats a lot of fish and shrimp, grows his own vegetables, makes his own tomatoes juice, pork chops, occasional steak, occasional baccon Dessert once a month Drink: ice tea (lightly sweetened), coffee (black) (regular) 3 glass of tea and 3 cups of coffee,  lemonade  Exercise: rides bike, total gym   Home BP readings: 163/80, 141/70, 159/80, 150/72, 170/86, 156/80, 147/78, 146/76, 149/72, 159/68, 164/82, 161/68, 163/83, 143/74, 138/69, 162/80, 120/59, 166/82, 133/79, 133/75, 163/82, 125/71, 135/89,   Wt Readings from Last 3 Encounters:  09/21/20 214 lb 3.2 oz (97.2 kg)  07/06/20 208 lb (94.3 kg)  04/14/20 207 lb (93.9 kg)   BP Readings from Last 3 Encounters:  04/02/21 (!) 144/72  09/21/20 (!) 152/90  07/06/20 130/72   Pulse Readings from Last 3 Encounters:  04/02/21 (!) 57  09/21/20 69  07/06/20 65    Renal function: CrCl cannot be calculated (Unknown ideal weight.).  Past Medical History:  Diagnosis Date  . CAD in native artery    a. 2009 - s/p PTCA/DES to RCA with wire-induced dissection in distal RCA. b. 2013 - s/p DES to dRCA and cutting balloon angioplasty to ISR to mRCA  . Cancer of skin of temple    "right"  . Cardiomyopathy ([place])    a. EF 45-50% in 2009. b. 50-55% by echo 04/2016.  [name] CKD (chronic kidney disease), stage III ([place])   . GERD (gastroesophageal reflux disease)   . Hyperlipidemia   . Hypertension   . Myocardial infarction ([place]) 2009  . Obesity   . Osteoarthritis     Current Outpatient Medications on File Prior to Visit  Medication Sig  Dispense Refill  . amLODipine (NORVASC) 2.5 MG tablet Take 1 tablet (2.5 mg total) by mouth daily. 90 tablet 3  . clopidogrel (PLAVIX) 75 MG tablet TAKE ONE TABLET BY MOUTH DAILY 90 tablet 3  . co-enzyme Q-10 30 MG capsule Take 200 mg by mouth daily.    [name] ezetimibe (ZETIA) 10 MG tablet TAKE ONE TABLET BY MOUTH EVERY DAY 90 tablet 3  . isosorbide mononitrate (IMDUR) 60 MG 24 hr tablet TAKE 1 AND 1/2 TABLETS BY MOUTH DAILY 135 tablet 2  . lisinopril (ZESTRIL) 40 MG tablet Take 1 tablet (40 mg total) by mouth daily. 90 tablet 3  . metoprolol tartrate (LOPRESSOR) 50 MG tablet TAKE ONE TABLET BY MOUTH TWICE DAILY 180 tablet 2  . nitroGLYCERIN (NITROSTAT) 0.4 MG SL tablet  Place 1 tablet (0.4 mg total) under the tongue every 5 (five) minutes as needed for chest pain (MAX 3 DOSES). 25 tablet 3  . pantoprazole (PROTONIX) 40 MG tablet Take 40 mg by mouth daily.    . rosuvastatin (CRESTOR) 40 MG tablet TAKE ONE TABLET BY MOUTH ONCE DAILY 90 tablet 2   No current facility-administered medications on file prior to visit.    Allergies  Allergen Reactions  . Acetaminophen Diarrhea and Nausea And Vomiting  . Niacin Other (See Comments)    flushing    There were no vitals taken for this visit.   Assessment/Plan:  1. Hypertension - Blood pressure above goal of <130/80 in clinic today. Home blood pressure cuff is not accurate. I wonder if this is why we are seeing so much variation at home. Will increase amlodipine to 5mg  daily. Continue isosorbide mononitrate 90mg  daily, lisinopril 40mg  daily and metoprolol tartrate 50mg  twice a day. I have asked the patient to get a new upper arm blood pressure monitor. Suggested an OMRON bronze or series 3. Follow up in clinic in 3 weeks. Continue implementing exercise into routine.  Thank you  Ramond Dial, Pharm.D, BCPS, CPP [place]  [address], [place], [place] [address]  Phone: [phone]; Fax: [phone]

## 2021-04-22 NOTE — Patient Instructions (Addendum)
It was nice to see you today!  Your blood pressure goal if <130/80  Please increase your amlodipine to 5mg  daily. You may take 2 tablets of the amlodipine 2.5mg  to equal 5mg  until you run out  Your pharmacy will send you the 5mg  tablets. Start taking 1 of those tablets daily once your 2.5mg  tablets are done.   Continue taking isosorbide mononitrate 90mg  daily, lisinopril 40mg  daily and metoprolol tartrate 50mg  twice a day  Call me at [phone] with any questions  Please buy a new upper arm blood pressure cuff

## 2021-05-04 DIAGNOSIS — I2510 Atherosclerotic heart disease of native coronary artery without angina pectoris: Secondary | ICD-10-CM | POA: Diagnosis not present

## 2021-05-04 DIAGNOSIS — K219 Gastro-esophageal reflux disease without esophagitis: Secondary | ICD-10-CM | POA: Diagnosis not present

## 2021-05-04 DIAGNOSIS — E782 Mixed hyperlipidemia: Secondary | ICD-10-CM | POA: Diagnosis not present

## 2021-05-04 DIAGNOSIS — I10 Essential (primary) hypertension: Secondary | ICD-10-CM | POA: Diagnosis not present

## 2021-05-13 ENCOUNTER — Ambulatory Visit (INDEPENDENT_AMBULATORY_CARE_PROVIDER_SITE_OTHER): Payer: Medicare HMO | Admitting: Pharmacist

## 2021-05-13 ENCOUNTER — Other Ambulatory Visit: Payer: Self-pay

## 2021-05-13 VITALS — BP 140/70 | HR 57

## 2021-05-13 DIAGNOSIS — I10 Essential (primary) hypertension: Secondary | ICD-10-CM

## 2021-05-13 MED ORDER — CARVEDILOL 3.125 MG PO TABS: 3.1250 mg | ORAL_TABLET | Freq: Two times a day (BID) | ORAL | 3 refills | Status: DC

## 2021-05-13 NOTE — Patient Instructions (Signed)
STOP taking metoprolol START taking carvedilol 3.125mg  twice a day CONTINUE isosorbide 90mg  daily, lisinopril 40mg  at night and amlodipine 5mg  at night  Start taking lisinopril at night time  Call me at [phone] with any questions  Continue checking blood pressure at home.

## 2021-05-13 NOTE — [place] Notes (Signed)
Patient ID: Thomas Farrell                 DOB: [birth year]                      MRN: [id number]     HPI: Thomas Farrell is a 80 y.o. male referred by Dr. Marlou Porch to HTN clinic. PMH is significant for HTN, CAD s/p sent to the RCA in 2009, 2013, HDL and elevated LFT's. He has had a history of low blood pressure in the past. Patient called the office 03/19/21 stating his BP at the dentist was 201/103. His lisinopril was increased to 40mg  daily and he was referred to the HTN clinic.  At last HTN clinic visit, patients blood pressure was 140/70. Amlodipine was increased to 5mg  daily. His home BP cuff was found to be inaccurate and he was encouraged to purchase a new one. Home readings varied greatly.   Patient presents today for follow up. He denies dizziness, lightheadedness, blurred vision, SOB or swelling. He states he wakes up with a slight headache. Blames it on sinuses. He bought a new blood pressure cuff. OMRON upper arm cuff. Compared to clinic reading 140/70 (clinic) vs 134/75 home. Found to be accurate. Clinic reading a few min prior was 132/70. He has been riding is bike 1 hr each day. States he cannot get his HR up due to his beta blocker. His BP is 694-854'O systolic in the AM but falls to 110/120's by evening. HR in low 50's (1 reading in the 40's) to 60's. Takes AM meds 7:30-8:30 PM meds 8PM takes amlodipine at 8PM.  Current HTN meds: isosorbide mononitrate 90mg  daily, lisinopril 40mg  daily, metoprolol tartrate 50mg  twice a day, amlodipine 5mg  daily  Previously tried: HCTZ (stopped due to low BP) BP goal: <130/80  Family History: The patient's family history includes Heart attack in his brother, father, and paternal uncle; Hypertension in an other family member. There is no history of Stroke.  Social History: former smoker, ETOH 1 drink per night  Diet: eats 98% of food at home. Uses a little salt, eats a lot of fish and shrimp, grows his own vegetables, makes his own tomatoes juice,  pork chops, occasional steak, occasional baccon Dessert once a month Drink: ice tea (lightly sweetened), coffee (black) (regular) 3 glass of tea and 3 cups of coffee, lemonade  Exercise: rides bike, total gym   Home BP readings: 141/82, 131/78, 120/66, 142/92, 154/89, 130/76, 155/88,135/80, 149/90, 113/64, 114/60, 153/90, 126/76, 110/63 HR 50-60's  Wt Readings from Last 3 Encounters:  09/21/20 214 lb 3.2 oz (97.2 kg)  07/06/20 208 lb (94.3 kg)  04/14/20 207 lb (93.9 kg)   BP Readings from Last 3 Encounters:  04/22/21 140/70  04/02/21 (!) 144/72  09/21/20 (!) 152/90   Pulse Readings from Last 3 Encounters:  04/22/21 73  04/02/21 (!) 57  09/21/20 69    Renal function: CrCl cannot be calculated (Patient's most recent lab result is older than the maximum 21 days allowed.).  Past Medical History:  Diagnosis Date   CAD in native artery    a. 2009 - s/p PTCA/DES to RCA with wire-induced dissection in distal RCA. b. 2013 - s/p DES to dRCA and cutting balloon angioplasty to ISR to mRCA   Cancer of skin of temple    "right"   Cardiomyopathy ([place])    a. EF 45-50% in 2009. b. 50-55% by echo 04/2016.   CKD (chronic kidney  disease), stage III ([place])    GERD (gastroesophageal reflux disease)    Hyperlipidemia    Hypertension    Myocardial infarction [facility]) 2009   Obesity    Osteoarthritis     Current Outpatient Medications on File Prior to Visit  Medication Sig Dispense Refill   amLODipine (NORVASC) 5 MG tablet Take 1 tablet (5 mg total) by mouth daily. 90 tablet 3   clopidogrel (PLAVIX) 75 MG tablet TAKE ONE TABLET BY MOUTH DAILY 90 tablet 3   co-enzyme Q-10 30 MG capsule Take 200 mg by mouth daily.     ezetimibe (ZETIA) 10 MG tablet TAKE ONE TABLET BY MOUTH EVERY DAY 90 tablet 3   isosorbide mononitrate (IMDUR) 60 MG 24 hr tablet TAKE 1 AND 1/2 TABLETS BY MOUTH DAILY 135 tablet 2   lisinopril (ZESTRIL) 40 MG tablet Take 1 tablet (40 mg total) by mouth daily. 90 tablet 3    metoprolol tartrate (LOPRESSOR) 50 MG tablet TAKE ONE TABLET BY MOUTH TWICE DAILY 180 tablet 2   nitroGLYCERIN (NITROSTAT) 0.4 MG SL tablet Place 1 tablet (0.4 mg total) under the tongue every 5 (five) minutes as needed for chest pain (MAX 3 DOSES). 25 tablet 3   pantoprazole (PROTONIX) 40 MG tablet Take 40 mg by mouth daily.     rosuvastatin (CRESTOR) 40 MG tablet TAKE ONE TABLET BY MOUTH ONCE DAILY 90 tablet 2   No current facility-administered medications on file prior to visit.    Allergies  Allergen Reactions   Acetaminophen Diarrhea and Nausea And Vomiting   Niacin Other (See Comments)    flushing    There were no vitals taken for this visit.   Assessment/Plan:  1. Hypertension - Blood pressure above goal of <130/80 in clinic today. His AM readings at home are elevated, but his later afternoon and PM readings are at goal. Some down into the 110's. His HR is also pretty low in the 50's. Seems to struggle to exercise any because he cant get his HR over 60's. Will try switching metoprolol to carvedilol to see if we can get a little better BP lowering. Will use lowest dose of 3.125mg  BID to hopefully not suppress the HR as much. I have also asked the patient to start taking his lisinopril at night to see if that improves his AM pressures. Follow up in office in 3 weeks.  Thank you  Ramond Dial, Pharm.D, BCPS, CPP [place]  [address] [place], [place] [address]  Phone: [phone]; Fax: [phone]

## 2021-05-23 ENCOUNTER — Other Ambulatory Visit: Payer: Self-pay | Admitting: Cardiology

## 2021-05-26 NOTE — Telephone Encounter (Signed)
Rx(s) sent to pharmacy electronically.  Metoprolol was sent over in error. Contacted pharmacy to discard the rx. Pharmacist voiced understanding.

## 2021-05-27 DIAGNOSIS — K219 Gastro-esophageal reflux disease without esophagitis: Secondary | ICD-10-CM | POA: Diagnosis not present

## 2021-05-27 DIAGNOSIS — I2510 Atherosclerotic heart disease of native coronary artery without angina pectoris: Secondary | ICD-10-CM | POA: Diagnosis not present

## 2021-05-27 DIAGNOSIS — I10 Essential (primary) hypertension: Secondary | ICD-10-CM | POA: Diagnosis not present

## 2021-05-27 DIAGNOSIS — E782 Mixed hyperlipidemia: Secondary | ICD-10-CM | POA: Diagnosis not present

## 2021-06-01 DIAGNOSIS — H35033 Hypertensive retinopathy, bilateral: Secondary | ICD-10-CM | POA: Diagnosis not present

## 2021-06-02 ENCOUNTER — Ambulatory Visit (INDEPENDENT_AMBULATORY_CARE_PROVIDER_SITE_OTHER): Payer: Medicare HMO | Admitting: Pharmacist

## 2021-06-02 ENCOUNTER — Other Ambulatory Visit: Payer: Self-pay

## 2021-06-02 VITALS — BP 142/70 | HR 58

## 2021-06-02 DIAGNOSIS — I10 Essential (primary) hypertension: Secondary | ICD-10-CM

## 2021-06-02 MED ORDER — HYDROCHLOROTHIAZIDE 12.5 MG PO CAPS: 12.5000 mg | ORAL_CAPSULE | Freq: Every day | ORAL | 3 refills | Status: DC

## 2021-06-02 NOTE — Patient Instructions (Addendum)
Its always a pleasure to see you!  Please START taking hydrochlorothiazide 12.5mg  daily in the AM.  Continue taking isosorbide mononitrate 90mg  daily, lisinopril 40mg  daily, carvedilol 3.125mg  twice a day and amlodipine 5mg  daily .  Continue riding your bike daily  Call me at [phone] with any questions

## 2021-06-02 NOTE — [place] Notes (Signed)
Patient ID: THADDUS MCDOWELL                 DOB: 03-01-1941                      MRN: [id number]     HPI: Thomas Farrell is a 80 y.o. male referred by Dr. Marlou Porch to HTN clinic. PMH is significant for HTN, CAD s/p sent to the RCA in 2009, 2013, HDL and elevated LFT's. He has had a history of low blood pressure in the past. Patient called the office 03/19/21 stating his BP at the dentist was 201/103. His lisinopril was increased to 40mg  daily and he was referred to the HTN clinic.  At last HTN clinic visit, patients blood pressure in clinic was 140/70. His home readings were in the 140-150 in the AM, but fell to 110/120's in the evening. HR was also in the low 50's. Metoprolol was changed to carvedilol 3.125mg  BID and he was asked to take his lisinopril at night. His home BP cuff was found to be accurate. OMRON upper arm cuff. Compared to clinic reading 140/70 (clinic) vs 134/75 home. Found to be accurate. Clinic reading a few min prior was 132/70.  Takes AM meds 7:30-8:30 PM meds 8PM takes amlodipine and lisinopril at 8PM.  Patient presents today for follow up. Blood pressure still high in AM (150-170 prior to AM medications) and 120-140's mostly in the late morning and evening. He states he is able to increase HR better while exercising since changing to carvedilol. Resting HR still in the 50's. He denies dizziness, lightheadedness, headache, blurred vision, SOB and swelling. Has been increasing the resistance on his exercise bike.  Current HTN meds: isosorbide mononitrate 90mg  daily, lisinopril 40mg  daily, carvedilol 3.125mg  BID, amlodipine 5mg  daily  Previously tried: HCTZ (stopped due to low BP) BP goal: <130/80  Family History: The patient's family history includes Heart attack in his brother, father, and paternal uncle; Hypertension in an other family member. There is no history of Stroke.  Social History: former smoker, ETOH 1 drink per night  Diet: eats 98% of food at home. Uses a  little salt, eats a lot of fish and shrimp, grows his own vegetables, makes his own tomatoes juice, pork chops, occasional steak, occasional baccon Dessert once a month Drink: ice tea (lightly sweetened), coffee (black) (regular) 3 glass of tea and 3 cups of coffee, lemonade  Exercise: rides bike 1 hr a day (working on increasing resistance), total gym   Home BP readings: AM 151/94, 148/82, 162/89, 176/93,157/89, 155/90, 164/94, 173/97, 147/81, 147/82, 151/87, 154/88, 154/88  PM 119/68, 130/76, 135/71, 125/73, 141/80, 131/81, 139/79, 139/88, 124/78, , 138/82 133/82, 137/78, 132/76, 114/76, 109/63, 129/70, 149/82, 126/71, 134/74, 134/74, 125/71, 127/74, 123/70 Wt Readings from Last 3 Encounters:  09/21/20 214 lb 3.2 oz (97.2 kg)  07/06/20 208 lb (94.3 kg)  04/14/20 207 lb (93.9 kg)   BP Readings from Last 3 Encounters:  05/13/21 140/70  04/22/21 140/70  04/02/21 (!) 144/72   Pulse Readings from Last 3 Encounters:  05/13/21 (!) 57  04/22/21 73  04/02/21 (!) 57    Renal function: CrCl cannot be calculated (Patient's most recent lab result is older than the maximum 21 days allowed.).  Past Medical History:  Diagnosis Date   CAD in native artery    a. 2009 - s/p PTCA/DES to RCA with wire-induced dissection in distal RCA. b. 2013 - s/p DES to dRCA and cutting balloon  angioplasty to ISR to mRCA   Cancer of skin of temple    "right"   Cardiomyopathy ([place])    a. EF 45-50% in 2009. b. 50-55% by echo 04/2016.   CKD (chronic kidney disease), stage III ([facility])    GERD (gastroesophageal reflux disease)    Hyperlipidemia    Hypertension    Myocardial infarction [facility]) 2009   Obesity    Osteoarthritis     Current Outpatient Medications on File Prior to Visit  Medication Sig Dispense Refill   amLODipine (NORVASC) 5 MG tablet Take 1 tablet (5 mg total) by mouth daily. 90 tablet 3   carvedilol (COREG) 3.125 MG tablet Take 1 tablet (3.125 mg total) by mouth 2 (two) times daily. 180 tablet 3    clopidogrel (PLAVIX) 75 MG tablet TAKE ONE TABLET BY MOUTH DAILY 90 tablet 3   co-enzyme Q-10 30 MG capsule Take 200 mg by mouth daily.     ezetimibe (ZETIA) 10 MG tablet TAKE ONE TABLET BY MOUTH EVERY DAY 90 tablet 3   isosorbide mononitrate (IMDUR) 60 MG 24 hr tablet TAKE 1 AND 1/2 TABLETS BY MOUTH ONCE DAILY 135 tablet 0   lisinopril (ZESTRIL) 40 MG tablet Take 1 tablet (40 mg total) by mouth daily. 90 tablet 3   nitroGLYCERIN (NITROSTAT) 0.4 MG SL tablet Place 1 tablet (0.4 mg total) under the tongue every 5 (five) minutes as needed for chest pain (MAX 3 DOSES). 25 tablet 3   pantoprazole (PROTONIX) 40 MG tablet Take 40 mg by mouth daily.     rosuvastatin (CRESTOR) 40 MG tablet Take 1 tablet (40 mg total) by mouth daily. 90 tablet 0   No current facility-administered medications on file prior to visit.    Allergies  Allergen Reactions   Acetaminophen Diarrhea and Nausea And Vomiting   Niacin Other (See Comments)    flushing    There were no vitals taken for this visit.   Assessment/Plan:  1. Hypertension - Blood pressure above goal of <130/80 in clinic today. His AM readings at home are elevated (prior to medications), but his later afternoon and PM readings are better, but several still above goal. Will add HCTZ 12.5mg  daily. Follow up in 2.5 weeks in clinic. Continue taking isosorbide mononitrate 90mg  daily, lisinopril 40mg  daily, carvedilol 3.125mg  twice a day and amlodipine 5mg  daily .   Thank you  Ramond Dial, Pharm.D, BCPS, CPP [place]  [address], [place], [place] [address]  Phone: [phone]; Fax: [phone]

## 2021-06-22 ENCOUNTER — Other Ambulatory Visit: Payer: Self-pay

## 2021-06-22 ENCOUNTER — Ambulatory Visit (INDEPENDENT_AMBULATORY_CARE_PROVIDER_SITE_OTHER): Payer: Medicare HMO | Admitting: Pharmacist

## 2021-06-22 VITALS — BP 138/64 | HR 56

## 2021-06-22 DIAGNOSIS — I10 Essential (primary) hypertension: Secondary | ICD-10-CM | POA: Diagnosis not present

## 2021-06-22 LAB — BASIC METABOLIC PANEL
BUN/Creatinine Ratio: 25 — ABNORMAL HIGH (ref 10–24)
BUN: 25 mg/dL (ref 8–27)
CO2: 21 mmol/L (ref 20–29)
Calcium: 9.2 mg/dL (ref 8.6–10.2)
Chloride: 102 mmol/L (ref 96–106)
Creatinine, Ser: 1.02 mg/dL (ref 0.76–1.27)
Glucose: 111 mg/dL — ABNORMAL HIGH (ref 65–99)
Potassium: 5.2 mmol/L (ref 3.5–5.2)
Sodium: 139 mmol/L (ref 134–144)
eGFR: 74 mL/min/{1.73_m2} (ref >59)

## 2021-06-22 NOTE — Patient Instructions (Addendum)
Continue isosorbide mononitrate '90mg'$  daily, lisinopril '40mg'$  daily, carvedilol 3.'125mg'$  twice a day, amlodipine '5mg'$  daily and hydrochlorothiazide 12.'5mg'$  daily.  Ideally we would like your blood pressure <130/80. Please call me if blood pressure is consisently >140/90  [phone]

## 2021-06-22 NOTE — [place] Notes (Signed)
Patient ID: Thomas Farrell                 DOB: 1940-12-12                      MRN: [id number]     HPI: Thomas Farrell is a 80 y.o. male referred by Dr. Marlou Porch to HTN clinic. PMH is significant for HTN, CAD s/p sent to the RCA in 2009, 2013, HDL and elevated LFT's. He has had a history of low blood pressure in the past. Patient called the office 03/19/21 stating his BP at the dentist was 201/103. His lisinopril was increased to '40mg'$  daily and he was referred to the HTN clinic.  At last HTN clinic visit, patients blood pressure in clinic was 142/70. His home readings AM readings were high, PM readings were better, but not consistently at goal. HCTZ 12.'5mg'$  daily was started.   His home BP cuff was found to be accurate. OMRON upper arm cuff. Compared to clinic reading 140/70 (clinic) vs 134/75 home. Found to be accurate. Clinic reading a few min prior was 132/70.  Takes AM meds 7:30-8:30 PM meds 8PM takes amlodipine and lisinopril at 8PM.  Patient presents today for follow up. Blood pressure still high in AM (130's-150 prior to AM medications), but this is lower than before. BP drops by afternoon to 110-120's. Energy level is ok, main thing that slows him down is his back. He denies dizziness, lightheadedness, headache, blurred vision, SOB and swelling. Has been increasing the resistance on his exercise bike and riding it every day.   Current HTN meds: isosorbide mononitrate '90mg'$  daily, lisinopril '40mg'$  daily, carvedilol 3.'125mg'$  BID, amlodipine '5mg'$  daily, HCTZ 12.'5mg'$  daily Previously tried: HCTZ (stopped due to low BP) BP goal: <130/80  Family History: The patient's family history includes Heart attack in his brother, father, and paternal uncle; Hypertension in an other family member. There is no history of Stroke.  Social History: former smoker, ETOH 1 drink per night  Diet: eats 98% of food at home. Uses a little salt, eats a lot of fish and shrimp, grows his own vegetables, makes his own  tomatoes juice, pork chops, occasional steak, occasional baccon Dessert once a month Drink: ice tea (lightly sweetened), coffee (black) (regular) 3 glass of tea and 3 cups of coffee, lemonade  Exercise: rides bike 1 hr a day (working on increasing resistance), total gym   Home BP readings: 136/83, 145/82, 151/85, 142/80, 128/76, 149/8, 6, 126/75, 147/84, 120/75, 107/63, 139/84, 134/79, 128/68, 148/81, 114/65, 153/83, 127/73, 132/82, 148/90, 122/71, 127/73, 137/77, 118/67, 139/79, 119/67, 137/77, 145/77, 129/74, 125/73, 139/81, 106/78, 129/80, 118/64, 121/70, 131/77, 126/72 HR 59-69  Wt Readings from Last 3 Encounters:  09/21/20 214 lb 3.2 oz (97.2 kg)  07/06/20 208 lb (94.3 kg)  04/14/20 207 lb (93.9 kg)   BP Readings from Last 3 Encounters:  06/02/21 (!) 142/70  05/13/21 140/70  04/22/21 140/70   Pulse Readings from Last 3 Encounters:  06/02/21 (!) 58  05/13/21 (!) 57  04/22/21 73    Renal function: CrCl cannot be calculated (Patient's most recent lab result is older than the maximum 21 days allowed.).  Past Medical History:  Diagnosis Date   CAD in native artery    a. 2009 - s/p PTCA/DES to RCA with wire-induced dissection in distal RCA. b. 2013 - s/p DES to dRCA and cutting balloon angioplasty to ISR to mRCA   Cancer of skin of temple    "  right"   Cardiomyopathy ([place])    a. EF 45-50% in 2009. b. 50-55% by echo 04/2016.   CKD (chronic kidney disease), stage III ([facility])    GERD (gastroesophageal reflux disease)    Hyperlipidemia    Hypertension    Myocardial infarction [facility]) 2009   Obesity    Osteoarthritis     Current Outpatient Medications on File Prior to Visit  Medication Sig Dispense Refill   amLODipine (NORVASC) 5 MG tablet Take 1 tablet (5 mg total) by mouth daily. 90 tablet 3   carvedilol (COREG) 3.125 MG tablet Take 1 tablet (3.125 mg total) by mouth 2 (two) times daily. 180 tablet 3   clopidogrel (PLAVIX) 75 MG tablet TAKE ONE TABLET BY MOUTH DAILY 90 tablet 3    co-enzyme Q-10 30 MG capsule Take 200 mg by mouth daily.     ezetimibe (ZETIA) 10 MG tablet TAKE ONE TABLET BY MOUTH EVERY DAY 90 tablet 3   hydrochlorothiazide (MICROZIDE) 12.5 MG capsule Take 1 capsule (12.5 mg total) by mouth daily. 90 capsule 3   isosorbide mononitrate (IMDUR) 60 MG 24 hr tablet TAKE 1 AND 1/2 TABLETS BY MOUTH ONCE DAILY 135 tablet 0   lisinopril (ZESTRIL) 40 MG tablet Take 1 tablet (40 mg total) by mouth daily. 90 tablet 3   nitroGLYCERIN (NITROSTAT) 0.4 MG SL tablet Place 1 tablet (0.4 mg total) under the tongue every 5 (five) minutes as needed for chest pain (MAX 3 DOSES). 25 tablet 3   pantoprazole (PROTONIX) 40 MG tablet Take 40 mg by mouth daily.     rosuvastatin (CRESTOR) 40 MG tablet Take 1 tablet (40 mg total) by mouth daily. 90 tablet 0   No current facility-administered medications on file prior to visit.    Allergies  Allergen Reactions   Acetaminophen Diarrhea and Nausea And Vomiting   Niacin Other (See Comments)    flushing    There were no vitals taken for this visit.   Assessment/Plan:  1. Hypertension - Blood pressure above goal of <130/80 in clinic today. His AM readings at home are elevated (prior to medications), but his later afternoon and PM readings have all been at goal. With patient being 80 years old, I am more lenient to allow for BP <140/90. Continue isosorbide mononitrate '90mg'$  daily, lisinopril '40mg'$  daily, carvedilol 3.'125mg'$  twice a day, amlodipine '5mg'$  daily and HCTZ 12.'5mg'$  daily. Check BMP today. Follow up prn.   Thank you  Ramond Dial, Pharm.D, BCPS, CPP [place]  [id number], [place], [place] [address]  Phone: [phone]; Fax: [phone]

## 2021-06-23 ENCOUNTER — Telehealth: Payer: Self-pay | Admitting: Pharmacist

## 2021-06-23 NOTE — Telephone Encounter (Signed)
Called pt and reviewed results. K is 5.2. Patient started taking K tablets (OTC) about a week ago. I advised the patient to stop taking these.

## 2021-06-25 DIAGNOSIS — M48062 Spinal stenosis, lumbar region with neurogenic claudication: Secondary | ICD-10-CM | POA: Diagnosis not present

## 2021-07-02 ENCOUNTER — Other Ambulatory Visit: Payer: Self-pay | Admitting: Orthopedic Surgery

## 2021-07-02 DIAGNOSIS — M545 Low back pain, unspecified: Secondary | ICD-10-CM

## 2021-07-18 ENCOUNTER — Ambulatory Visit
Admission: RE | Admit: 2021-07-18 | Discharge: 2021-07-18 | Disposition: A | Discharge status: Home / Self Care | Payer: Medicare HMO | Source: Ambulatory Visit | Attending: Orthopedic Surgery | Admitting: Orthopedic Surgery

## 2021-07-18 ENCOUNTER — Other Ambulatory Visit: Payer: Self-pay

## 2021-07-18 DIAGNOSIS — M545 Low back pain, unspecified: Secondary | ICD-10-CM

## 2021-07-18 DIAGNOSIS — M48061 Spinal stenosis, lumbar region without neurogenic claudication: Secondary | ICD-10-CM | POA: Diagnosis not present

## 2021-08-02 DIAGNOSIS — M48062 Spinal stenosis, lumbar region with neurogenic claudication: Secondary | ICD-10-CM | POA: Diagnosis not present

## 2021-08-04 ENCOUNTER — Telehealth: Payer: Self-pay | Admitting: *Deleted

## 2021-08-04 NOTE — Telephone Encounter (Signed)
Dr. Marlou Porch We are asked to hold plavix for 5-7 days for lumbar ESI. Remote history of PCI in 2009 and 2013. OK to hold?

## 2021-08-04 NOTE — Telephone Encounter (Signed)
   [facility] Pre-operative Risk Assessment    Patient Name: Thomas Farrell  DOB: 1941/04/05 MRN: [id number]  HEARTCARE STAFF:  - IMPORTANT!!!!!! Under Visit Info/Reason for Call, type in Other and utilize the format Clearance MM/DD/YY or Clearance TBD. Do not use dashes or single digits. - Please review there is not already an duplicate clearance open for this procedure. - If request is for dental extraction, please clarify the # of teeth to be extracted. - If the patient is currently at the dentist's office, call Pre-Op Callback Staff (MA/nurse) to input urgent request.  - If the patient is not currently in the dentist office, please route to the Pre-Op pool.  Request for surgical clearance:  What type of surgery is being performed? Lumbar IL-ESI  When is this surgery scheduled? TBD  What type of clearance is required (medical clearance vs. Pharmacy clearance to hold med vs. Both)? Both  Are there any medications that need to be held prior to surgery and how long? Plavix 5-7 days  Practice name and name of physician performing surgery? [facility]; Dr Mina Marble  What is the office phone number? [phone]   7.   What is the office fax number? [place]   Anesthesia type (None, local, MAC, general) ? Local   Thomas Farrell L 08/04/2021, 12:47 PM  _________________________________________________________________   (provider comments below)

## 2021-08-06 NOTE — Telephone Encounter (Signed)
   Name: Thomas Farrell  DOB: Jun 20, 1941  MRN: [id number]   Primary Cardiologist: Candee Furbish, MD  Chart reviewed as part of pre-operative protocol coverage for lumbar spine procedure on date TBD.  Patient has history of PCI in 2009 and 2013.  Patient was contacted 08/06/2021 in reference to pre-operative risk assessment for pending surgery as outlined below.  Thomas Farrell was last seen on 09/21/2020 by Dr. Marlou Porch.  Since that day, Thomas Farrell has been doing well and followed up with pharmacy several times with most recent visit 06/22/2021. When last seen 8/2 for hypertension, BP high in the morning with recommendation to follow-up as needed.  He has been doing well and reports the main limitation to his activity has been his back.  He has been on his exercise bike for an hour on a regular basis and denies any anginal symptoms with this activity.  Patient has been advised to contact the office if any new or concerning symptoms before his procedure.  Based on ACC/AHA guidelines, the patient is acceptable risk for the planned procedure without further cardiovascular testing.   Per Dr. Marlou Porch, recommendation is to hold Plavix for 5 -7 days prior to lumbar spine surgery.  I will route this recommendation to the requesting party via Epic fax function and remove from pre-op pool. Please call with questions.  Arvil Chaco, PA-C 08/06/2021, 12:56 PM

## 2021-08-12 DIAGNOSIS — I10 Essential (primary) hypertension: Secondary | ICD-10-CM | POA: Diagnosis not present

## 2021-08-12 DIAGNOSIS — K219 Gastro-esophageal reflux disease without esophagitis: Secondary | ICD-10-CM | POA: Diagnosis not present

## 2021-08-12 DIAGNOSIS — E782 Mixed hyperlipidemia: Secondary | ICD-10-CM | POA: Diagnosis not present

## 2021-08-12 DIAGNOSIS — I2510 Atherosclerotic heart disease of native coronary artery without angina pectoris: Secondary | ICD-10-CM | POA: Diagnosis not present

## 2021-08-23 DIAGNOSIS — I10 Essential (primary) hypertension: Secondary | ICD-10-CM | POA: Diagnosis not present

## 2021-08-23 DIAGNOSIS — I2510 Atherosclerotic heart disease of native coronary artery without angina pectoris: Secondary | ICD-10-CM | POA: Diagnosis not present

## 2021-08-23 DIAGNOSIS — E782 Mixed hyperlipidemia: Secondary | ICD-10-CM | POA: Diagnosis not present

## 2021-08-23 DIAGNOSIS — K219 Gastro-esophageal reflux disease without esophagitis: Secondary | ICD-10-CM | POA: Diagnosis not present

## 2021-08-24 DIAGNOSIS — M48062 Spinal stenosis, lumbar region with neurogenic claudication: Secondary | ICD-10-CM | POA: Diagnosis not present

## 2021-08-25 ENCOUNTER — Other Ambulatory Visit: Payer: Self-pay | Admitting: Cardiology

## 2021-08-25 DIAGNOSIS — Z0000 Encounter for general adult medical examination without abnormal findings: Secondary | ICD-10-CM | POA: Diagnosis not present

## 2021-08-25 DIAGNOSIS — I10 Essential (primary) hypertension: Secondary | ICD-10-CM | POA: Diagnosis not present

## 2021-08-25 DIAGNOSIS — Z23 Encounter for immunization: Secondary | ICD-10-CM | POA: Diagnosis not present

## 2021-08-25 DIAGNOSIS — K219 Gastro-esophageal reflux disease without esophagitis: Secondary | ICD-10-CM | POA: Diagnosis not present

## 2021-08-25 DIAGNOSIS — R899 Unspecified abnormal finding in specimens from other organs, systems and tissues: Secondary | ICD-10-CM | POA: Diagnosis not present

## 2021-08-25 DIAGNOSIS — E782 Mixed hyperlipidemia: Secondary | ICD-10-CM | POA: Diagnosis not present

## 2021-08-25 DIAGNOSIS — R7301 Impaired fasting glucose: Secondary | ICD-10-CM | POA: Diagnosis not present

## 2021-09-02 ENCOUNTER — Other Ambulatory Visit: Payer: Self-pay

## 2021-09-02 ENCOUNTER — Encounter: Payer: Self-pay | Admitting: Cardiology

## 2021-09-02 ENCOUNTER — Ambulatory Visit (INDEPENDENT_AMBULATORY_CARE_PROVIDER_SITE_OTHER): Payer: Medicare HMO | Admitting: Cardiology

## 2021-09-02 DIAGNOSIS — I10 Essential (primary) hypertension: Secondary | ICD-10-CM | POA: Diagnosis not present

## 2021-09-02 DIAGNOSIS — I2510 Atherosclerotic heart disease of native coronary artery without angina pectoris: Secondary | ICD-10-CM | POA: Diagnosis not present

## 2021-09-02 DIAGNOSIS — E7800 Pure hypercholesterolemia, unspecified: Secondary | ICD-10-CM

## 2021-09-02 NOTE — Assessment & Plan Note (Signed)
On Crestor as well as Zetia.  LDL 57 in the past.  Excellent.  Hemoglobin 14.1 creatinine 0.9 ALT 39 within normal range.  Outside labs.  Excellent job

## 2021-09-02 NOTE — Assessment & Plan Note (Signed)
Has had bouts of hypotension. Much improved.  Continuing with lisinopril isosorbide prescription drug management. Stopped HCTZ prior because of super low BP.

## 2021-09-02 NOTE — Progress Notes (Signed)
Cardiology Office Note:    Date:  09/02/2021   ID:  Thomas Farrell, DOB [birth year], MRN [id number]  PCP:  Merryl Hacker, No  [facility] Cardiologist:  Candee Furbish, MD  [place] Electrophysiologist:  None   Referring MD: No ref. provider found    History of Present Illness:    Thomas Farrell is a 80 y.o. male here for the follow-up of hypertension, coronary artery disease, and hyperlipidemia.   In his previous visit, he felt too weak to play golf, felt like he was going to fall over.  Blood pressures were soft, in the 60s.  Ate some additional salt and felt better.  Had an inner ear infection at that time.  Previously had drug-eluting stent to the RCA 2009, 2013  Today: Overall he feels great. He recently got back to walking after getting shots in his back on 08/24/2021. He has had chronic back pain and bilateral hip pain. Reportedly, he can walk about 0.3 miles before he needs to rest due to his hip pain. He is able to get back to exercise. He can walk upstairs without getting winded.  He gets headaches in the morning that he believes is from sinus congestion.  He stopped the medication Flomax because it caused severe fatigue.  He has had 2 Covid immunizations and 2 boosters.   He denies any palpitations, chest pain, or shortness of breath. No lightheadedness, syncope, orthopnea, or PND. Also has no lower extremity edema.  Past Medical History:  Diagnosis Date   CAD in native artery    a. 2009 - s/p PTCA/DES to RCA with wire-induced dissection in distal RCA. b. 2013 - s/p DES to dRCA and cutting balloon angioplasty to ISR to mRCA   Cancer of skin of temple    "right"   Cardiomyopathy ([place])    a. EF 45-50% in 2009. b. 50-55% by echo 04/2016.   CKD (chronic kidney disease), stage III (HCC)    GERD (gastroesophageal reflux disease)    Hyperlipidemia    Hypertension    Myocardial infarction [facility]) 2009   Obesity    Osteoarthritis     Past Surgical History:  Procedure  Laterality Date   CARDIAC CATHETERIZATION  2009;  12/2011   CORONARY ANGIOPLASTY WITH STENT PLACEMENT  2009; 01/2012   "~ 2wk after each cath"   INGUINAL HERNIA REPAIR Right ~ 2010   [place] (PCI-S) N/A 01/25/2012   Procedure: PERCUTANEOUS CORONARY STENT INTERVENTION (PCI-S);  Surgeon: Jettie Booze, MD;  Location: [facility] CATH LAB;  Service: Cardiovascular;  Laterality: N/A;   SKIN CANCER EXCISION Right    temple   TONSILLECTOMY      Current Medications: Current Meds  Medication Sig   amLODipine (NORVASC) 5 MG tablet Take 1 tablet (5 mg total) by mouth daily.   carvedilol (COREG) 3.125 MG tablet Take 1 tablet (3.125 mg total) by mouth 2 (two) times daily.   clopidogrel (PLAVIX) 75 MG tablet TAKE ONE TABLET BY MOUTH DAILY   co-enzyme Q-10 30 MG capsule Take 200 mg by mouth daily.   ezetimibe (ZETIA) 10 MG tablet TAKE ONE TABLET BY MOUTH ONCE DAILY   hydrochlorothiazide (MICROZIDE) 12.5 MG capsule Take 1 capsule (12.5 mg total) by mouth daily.   isosorbide mononitrate (IMDUR) 60 MG 24 hr tablet TAKE 1 AND 1/2 TABLETS BY MOUTH ONCE DAILY   lisinopril (ZESTRIL) 40 MG tablet Take 1 tablet (40 mg total) by mouth daily.   nitroGLYCERIN (NITROSTAT) 0.4 MG SL  tablet Place 1 tablet (0.4 mg total) under the tongue every 5 (five) minutes as needed for chest pain (MAX 3 DOSES).   pantoprazole (PROTONIX) 40 MG tablet Take 40 mg by mouth daily.   rosuvastatin (CRESTOR) 40 MG tablet TAKE ONE TABLET BY MOUTH ONCE DAILY     Allergies:   Acetaminophen and Niacin   Social History   Socioeconomic History   Marital status: Married    Spouse name: Not on file   Number of children: Not on file   Years of education: Not on file   Highest education level: Not on file  Occupational History   Not on file  Tobacco Use   Smoking status: Former    Packs/day: 4.00    Years: 43.00    Pack years: 172.00    Types: Cigarettes    Quit date: 01/24/2002    Years since quitting:  19.6   Smokeless tobacco: Never  Vaping Use   Vaping Use: Never used  Substance and Sexual Activity   Alcohol use: Yes    Alcohol/week: 14.0 standard drinks    Types: 14 Glasses of wine per week   Drug use: No   Sexual activity: Not Currently  Other Topics Concern   Not on file  Social History Narrative   Not on file   Social Determinants of Health   Financial Resource Strain: Not on file  Food Insecurity: Not on file  Transportation Needs: Not on file  Physical Activity: Not on file  Stress: Not on file  Social Connections: Not on file     Family History: The patient's family history includes Heart attack in his brother, father, and paternal uncle; Hypertension in an other family member. There is no history of Stroke.  ROS:   Please see the history of present illness.   (+) Back Pain (+) Bilateral Hip pain (+) Headaches (+) Congestion   All other systems reviewed and are negative.  EKGs/Labs/Other Studies Reviewed:    The following studies were reviewed today: ABI 02/25/20 Summary:  Right: Resting right ankle-brachial index is within normal range. No  evidence of significant right lower extremity arterial disease. The right  toe-brachial index is normal.   Left: Resting left ankle-brachial index is within normal range. No  evidence of significant left lower extremity arterial disease. The left  toe-brachial index is normal.   Echocardiogram 05/11/2016: - Left ventricle: The cavity size was normal. There was mild focal    basal hypertrophy of the septum. Systolic function was normal.    The estimated ejection fraction was in the range of 50% to 55%.    Mild hypokinesis of the inferolateral and inferior myocardium.  - Aortic valve: Trileaflet; mildly thickened, mildly calcified    leaflets. Transvalvular velocity was within the normal range.    There was no stenosis. There was no regurgitation.  - Mitral valve: Transvalvular velocity was within the normal range.     There was no evidence for stenosis. There was trivial    regurgitation.  - Left atrium: The atrium was moderately dilated.  - Right ventricle: The cavity size was normal. Wall thickness was    normal. Systolic function was normal.  - Atrial septum: No defect or patent foramen ovale was identified    by color flow Doppler.  - Tricuspid valve: There was trivial regurgitation.  - Pulmonary arteries: Systolic pressure was within the normal    range. PA peak pressure: 25 mm Hg (S).   NM Myocar Multi 04/21/2016  There was no ST segment deviation noted during stress. There is a medium defect of moderate severity present in the basal inferior, mid inferior and apical inferior location. 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 No ischemia. There is a medium defect of severe severity present in the basal inferolateral, mid inferolateral and apical lateral location. 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 No ischemia. The left ventricular ejection fraction is moderately decreased (30-44%). Nuclear stress EF: 39%. This is a low risk study.  EKG: EKG is personally reviewed and interpreted. 09/02/2021: Sinus Rhythm Rate 62 bpm with PVCs and PACs 04/14/2020-sinus rhythm.  Recent Labs: 06/22/2021: BUN 25; Creatinine, Ser 1.02; Potassium 5.2; Sodium 139  Recent Lipid Panel    Component Value Date/Time   CHOL 131 11/22/2013 1332   TRIG 94.0 11/22/2013 1332   HDL 50.40 11/22/2013 1332   CHOLHDL 3 11/22/2013 1332   VLDL 18.8 11/22/2013 1332   LDLCALC 62 11/22/2013 1332     Risk Assessment/Calculations:       Physical Exam:    VS:  BP 130/70 (BP Location: Left Arm, Patient Position: Sitting, Cuff Size: Normal)   Pulse 62   Ht 5\' 7"  (1.702 m)   Wt 206 lb (93.4 kg)   BMI 32.26 kg/m     Wt Readings from Last 3 Encounters:  09/02/21 206 lb (93.4 kg)  09/21/20 214 lb 3.2 oz (97.2 kg)  07/06/20 208 lb (94.3 kg)     GEN:  Well nourished, well  developed in no acute distress HEENT: Normal NECK: No JVD; No carotid bruits LYMPHATICS: No lymphadenopathy CARDIAC: RRR, no murmurs, rubs, gallops RESPIRATORY:  Clear to auscultation without rales, wheezing or rhonchi  ABDOMEN: Soft, non-tender, non-distended MUSCULOSKELETAL:  No edema; No deformity  SKIN: Warm and dry NEUROLOGIC:  Alert and oriented x 3 PSYCHIATRIC:  Normal affect   ASSESSMENT:    1. Atherosclerosis of native coronary artery of native heart without angina pectoris   2. Primary hypertension   3. Pure hypercholesterolemia     PLAN:    In order of problems listed above:  Coronary atherosclerosis of native coronary artery Prior stents reviewed from cardiac catheterization as above.  Continue with medical management.  Has nitroglycerin if needed for anginal symptoms. Metoprolol for angina.  -On Plavix monotherapy.    Hypertension Has had bouts of hypotension. Much improved.  Continuing with lisinopril isosorbide prescription drug management. Stopped HCTZ prior because of super low BP.   Hyperlipidemia On Crestor as well as Zetia.  LDL 57 in the past.  Excellent.  Hemoglobin 14.1 creatinine 0.9 ALT 39 within normal range.  Outside labs.  Excellent job  Wants to live to 89  Follow-up: 1 year  Medication Adjustments/Labs and Tests Ordered: Current medicines are reviewed at length with the patient today.  Concerns regarding medicines are outlined above.  Orders Placed This Encounter  Procedures   EKG 12-Lead    No orders of the defined types were placed in this encounter.   Patient Instructions  Medication Instructions:  The current medical regimen is effective;  continue present plan and medications.  *If you need a refill on your cardiac medications before your next appointment, please call your pharmacy*  Follow-Up: At [facility], you and your health needs are our priority.  As part of our continuing mission to provide you with exceptional  heart care, we have created designated Provider Care Teams.  These Care Teams include your primary Cardiologist (physician) and Advanced Practice  Providers (APPs -  Physician Assistants and Nurse Practitioners) who all work together to provide you with the care you need, when you need it.  We recommend signing up for the patient portal called "MyChart".  Sign up information is provided on this After Visit Summary.  MyChart is used to connect with patients for Virtual Visits (Telemedicine).  Patients are able to view lab/test results, encounter notes, upcoming appointments, etc.  Non-urgent messages can be sent to your provider as well.   To learn more about what you can do with MyChart, go to [email or web address].    Your next appointment:   1 year(s)  The format for your next appointment:   In Person  Provider:   Candee Furbish, MD   Thank you for choosing [facility]!!      Wilhemina Bonito as a scribe for Candee Furbish, MD.,have documented all relevant documentation on the behalf of Candee Furbish, MD,as directed by  Candee Furbish, MD while in the presence of Candee Furbish, MD.  I, Candee Furbish, MD, have reviewed all documentation for this visit. The documentation on 09/02/21 for the exam, diagnosis, procedures, and orders are all accurate and complete.   Signed, Candee Furbish, MD  09/02/2021 4:45 PM     [facility]

## 2021-09-02 NOTE — Assessment & Plan Note (Signed)
Prior stents reviewed from cardiac catheterization as above.  Continue with medical management.  Has nitroglycerin if needed for anginal symptoms. Metoprolol for angina.  -On Plavix monotherapy.

## 2021-09-02 NOTE — Patient Instructions (Signed)
Medication Instructions:  The current medical regimen is effective;  continue present plan and medications.  *If you need a refill on your cardiac medications before your next appointment, please call your pharmacy*  Follow-Up: At [facility], you and your health needs are our priority.  As part of our continuing mission to provide you with exceptional heart care, we have created designated Provider Care Teams.  These Care Teams include your primary Cardiologist (physician) and Advanced Practice Providers (APPs -  Physician Assistants and Nurse Practitioners) who all work together to provide you with the care you need, when you need it.  We recommend signing up for the patient portal called "MyChart".  Sign up information is provided on this After Visit Summary.  MyChart is used to connect with patients for Virtual Visits (Telemedicine).  Patients are able to view lab/test results, encounter notes, upcoming appointments, etc.  Non-urgent messages can be sent to your provider as well.   To learn more about what you can do with MyChart, go to [email or web address].    Your next appointment:   1 year(s)  The format for your next appointment:   In Person  Provider:   Mark Skains, MD   Thank you for choosing [facility]!!    

## 2021-09-14 DIAGNOSIS — M48062 Spinal stenosis, lumbar region with neurogenic claudication: Secondary | ICD-10-CM | POA: Diagnosis not present

## 2021-10-13 DIAGNOSIS — E782 Mixed hyperlipidemia: Secondary | ICD-10-CM | POA: Diagnosis not present

## 2021-10-13 DIAGNOSIS — I10 Essential (primary) hypertension: Secondary | ICD-10-CM | POA: Diagnosis not present

## 2021-10-13 DIAGNOSIS — I2510 Atherosclerotic heart disease of native coronary artery without angina pectoris: Secondary | ICD-10-CM | POA: Diagnosis not present

## 2021-10-13 DIAGNOSIS — K219 Gastro-esophageal reflux disease without esophagitis: Secondary | ICD-10-CM | POA: Diagnosis not present

## 2021-11-19 ENCOUNTER — Other Ambulatory Visit: Payer: Self-pay | Admitting: Cardiology

## 2021-12-06 DIAGNOSIS — H5203 Hypermetropia, bilateral: Secondary | ICD-10-CM | POA: Diagnosis not present

## 2021-12-06 DIAGNOSIS — H35033 Hypertensive retinopathy, bilateral: Secondary | ICD-10-CM | POA: Diagnosis not present

## 2021-12-19 DIAGNOSIS — I129 Hypertensive chronic kidney disease with stage 1 through stage 4 chronic kidney disease, or unspecified chronic kidney disease: Secondary | ICD-10-CM | POA: Diagnosis not present

## 2021-12-19 DIAGNOSIS — E785 Hyperlipidemia, unspecified: Secondary | ICD-10-CM | POA: Diagnosis not present

## 2021-12-19 DIAGNOSIS — I25119 Atherosclerotic heart disease of native coronary artery with unspecified angina pectoris: Secondary | ICD-10-CM | POA: Diagnosis not present

## 2021-12-19 DIAGNOSIS — I700 Atherosclerosis of aorta: Secondary | ICD-10-CM | POA: Diagnosis not present

## 2021-12-19 DIAGNOSIS — I252 Old myocardial infarction: Secondary | ICD-10-CM | POA: Diagnosis not present

## 2021-12-19 DIAGNOSIS — G8929 Other chronic pain: Secondary | ICD-10-CM | POA: Diagnosis not present

## 2021-12-19 DIAGNOSIS — M4800 Spinal stenosis, site unspecified: Secondary | ICD-10-CM | POA: Diagnosis not present

## 2021-12-19 DIAGNOSIS — I739 Peripheral vascular disease, unspecified: Secondary | ICD-10-CM | POA: Diagnosis not present

## 2021-12-19 DIAGNOSIS — M792 Neuralgia and neuritis, unspecified: Secondary | ICD-10-CM | POA: Diagnosis not present

## 2021-12-19 DIAGNOSIS — C679 Malignant neoplasm of bladder, unspecified: Secondary | ICD-10-CM | POA: Diagnosis not present

## 2021-12-19 DIAGNOSIS — K219 Gastro-esophageal reflux disease without esophagitis: Secondary | ICD-10-CM | POA: Diagnosis not present

## 2021-12-19 DIAGNOSIS — I429 Cardiomyopathy, unspecified: Secondary | ICD-10-CM | POA: Diagnosis not present

## 2022-02-18 ENCOUNTER — Other Ambulatory Visit: Payer: Self-pay | Admitting: Cardiology

## 2022-05-15 ENCOUNTER — Other Ambulatory Visit: Payer: Self-pay | Admitting: Cardiology

## 2022-05-30 ENCOUNTER — Emergency Department (HOSPITAL_BASED_OUTPATIENT_CLINIC_OR_DEPARTMENT_OTHER): Payer: Medicare HMO

## 2022-05-30 ENCOUNTER — Inpatient Hospital Stay (HOSPITAL_COMMUNITY): Payer: Medicare HMO

## 2022-05-30 ENCOUNTER — Encounter (HOSPITAL_BASED_OUTPATIENT_CLINIC_OR_DEPARTMENT_OTHER): Payer: Self-pay

## 2022-05-30 ENCOUNTER — Inpatient Hospital Stay (HOSPITAL_BASED_OUTPATIENT_CLINIC_OR_DEPARTMENT_OTHER)
Admission: EM | Admit: 2022-05-30 | Discharge: 2022-06-02 | DRG: 683 | Disposition: A | Discharge status: Home / Self Care | Payer: Medicare HMO | Attending: Internal Medicine | Admitting: Internal Medicine

## 2022-05-30 ENCOUNTER — Other Ambulatory Visit: Payer: Self-pay

## 2022-05-30 DIAGNOSIS — M47816 Spondylosis without myelopathy or radiculopathy, lumbar region: Secondary | ICD-10-CM | POA: Diagnosis present

## 2022-05-30 DIAGNOSIS — R339 Retention of urine, unspecified: Secondary | ICD-10-CM | POA: Diagnosis present

## 2022-05-30 DIAGNOSIS — Z955 Presence of coronary angioplasty implant and graft: Secondary | ICD-10-CM | POA: Diagnosis not present

## 2022-05-30 DIAGNOSIS — I129 Hypertensive chronic kidney disease with stage 1 through stage 4 chronic kidney disease, or unspecified chronic kidney disease: Secondary | ICD-10-CM | POA: Diagnosis not present

## 2022-05-30 DIAGNOSIS — Z79899 Other long term (current) drug therapy: Secondary | ICD-10-CM | POA: Diagnosis not present

## 2022-05-30 DIAGNOSIS — E871 Hypo-osmolality and hyponatremia: Secondary | ICD-10-CM | POA: Diagnosis present

## 2022-05-30 DIAGNOSIS — N401 Enlarged prostate with lower urinary tract symptoms: Secondary | ICD-10-CM | POA: Diagnosis present

## 2022-05-30 DIAGNOSIS — Z8249 Family history of ischemic heart disease and other diseases of the circulatory system: Secondary | ICD-10-CM

## 2022-05-30 DIAGNOSIS — K219 Gastro-esophageal reflux disease without esophagitis: Secondary | ICD-10-CM | POA: Diagnosis not present

## 2022-05-30 DIAGNOSIS — I4891 Unspecified atrial fibrillation: Secondary | ICD-10-CM | POA: Diagnosis not present

## 2022-05-30 DIAGNOSIS — R739 Hyperglycemia, unspecified: Secondary | ICD-10-CM | POA: Diagnosis not present

## 2022-05-30 DIAGNOSIS — R338 Other retention of urine: Secondary | ICD-10-CM

## 2022-05-30 DIAGNOSIS — Z886 Allergy status to analgesic agent status: Secondary | ICD-10-CM | POA: Diagnosis not present

## 2022-05-30 DIAGNOSIS — N19 Unspecified kidney failure: Secondary | ICD-10-CM

## 2022-05-30 DIAGNOSIS — I2510 Atherosclerotic heart disease of native coronary artery without angina pectoris: Secondary | ICD-10-CM | POA: Diagnosis present

## 2022-05-30 DIAGNOSIS — N179 Acute kidney failure, unspecified: Principal | ICD-10-CM | POA: Diagnosis present

## 2022-05-30 DIAGNOSIS — N183 Chronic kidney disease, stage 3 unspecified: Secondary | ICD-10-CM | POA: Diagnosis not present

## 2022-05-30 DIAGNOSIS — Z87891 Personal history of nicotine dependence: Secondary | ICD-10-CM | POA: Diagnosis not present

## 2022-05-30 DIAGNOSIS — E785 Hyperlipidemia, unspecified: Secondary | ICD-10-CM | POA: Diagnosis not present

## 2022-05-30 DIAGNOSIS — N171 Acute kidney failure with acute cortical necrosis: Secondary | ICD-10-CM | POA: Diagnosis not present

## 2022-05-30 DIAGNOSIS — N320 Bladder-neck obstruction: Secondary | ICD-10-CM | POA: Diagnosis not present

## 2022-05-30 DIAGNOSIS — Z888 Allergy status to other drugs, medicaments and biological substances status: Secondary | ICD-10-CM

## 2022-05-30 DIAGNOSIS — E861 Hypovolemia: Secondary | ICD-10-CM | POA: Diagnosis not present

## 2022-05-30 DIAGNOSIS — R3914 Feeling of incomplete bladder emptying: Secondary | ICD-10-CM | POA: Diagnosis not present

## 2022-05-30 DIAGNOSIS — I429 Cardiomyopathy, unspecified: Secondary | ICD-10-CM | POA: Diagnosis present

## 2022-05-30 DIAGNOSIS — R945 Abnormal results of liver function studies: Secondary | ICD-10-CM | POA: Diagnosis not present

## 2022-05-30 DIAGNOSIS — I252 Old myocardial infarction: Secondary | ICD-10-CM

## 2022-05-30 DIAGNOSIS — Z7902 Long term (current) use of antithrombotics/antiplatelets: Secondary | ICD-10-CM

## 2022-05-30 DIAGNOSIS — N138 Other obstructive and reflux uropathy: Secondary | ICD-10-CM | POA: Diagnosis present

## 2022-05-30 DIAGNOSIS — M545 Low back pain, unspecified: Secondary | ICD-10-CM | POA: Diagnosis not present

## 2022-05-30 DIAGNOSIS — R7401 Elevation of levels of liver transaminase levels: Secondary | ICD-10-CM | POA: Diagnosis present

## 2022-05-30 LAB — CBC WITH DIFFERENTIAL/PLATELET
Abs Immature Granulocytes: 0.07 10*3/uL (ref 0.00–0.07)
Basophils Absolute: 0 10*3/uL (ref 0.0–0.1)
Basophils Relative: 0 %
Eosinophils Absolute: 0 10*3/uL (ref 0.0–0.5)
Eosinophils Relative: 0 %
HCT: 37.9 % — ABNORMAL LOW (ref 39.0–52.0)
Hemoglobin: 13.1 g/dL (ref 13.0–17.0)
Immature Granulocytes: 1 %
Lymphocytes Relative: 7 %
Lymphs Abs: 0.7 10*3/uL (ref 0.7–4.0)
MCH: 29.4 pg (ref 26.0–34.0)
MCHC: 34.6 g/dL (ref 30.0–36.0)
MCV: 85 fL (ref 80.0–100.0)
Monocytes Absolute: 1.3 10*3/uL — ABNORMAL HIGH (ref 0.1–1.0)
Monocytes Relative: 13 %
Neutro Abs: 7.7 10*3/uL (ref 1.7–7.7)
Neutrophils Relative %: 79 %
Platelets: 184 10*3/uL (ref 150–400)
RBC: 4.46 MIL/uL (ref 4.22–5.81)
RDW: 14.6 % (ref 11.5–15.5)
WBC: 9.8 10*3/uL (ref 4.0–10.5)
nRBC: 0 % (ref 0.0–0.2)

## 2022-05-30 LAB — COMPREHENSIVE METABOLIC PANEL
ALT: 272 U/L — ABNORMAL HIGH (ref 0–44)
AST: 499 U/L — ABNORMAL HIGH (ref 15–41)
Albumin: 3.4 g/dL — ABNORMAL LOW (ref 3.5–5.0)
Alkaline Phosphatase: 110 U/L (ref 38–126)
Anion gap: 17 — ABNORMAL HIGH (ref 5–15)
BUN: 119 mg/dL — ABNORMAL HIGH (ref 8–23)
CO2: 20 mmol/L — ABNORMAL LOW (ref 22–32)
Calcium: 9 mg/dL (ref 8.9–10.3)
Chloride: 94 mmol/L — ABNORMAL LOW (ref 98–111)
Creatinine, Ser: 4.54 mg/dL — ABNORMAL HIGH (ref 0.61–1.24)
GFR, Estimated: 12 mL/min — ABNORMAL LOW (ref >60)
Glucose, Bld: 173 mg/dL — ABNORMAL HIGH (ref 70–99)
Potassium: 3.6 mmol/L (ref 3.5–5.1)
Sodium: 131 mmol/L — ABNORMAL LOW (ref 135–145)
Total Bilirubin: 0.7 mg/dL (ref 0.3–1.2)
Total Protein: 6.7 g/dL (ref 6.5–8.1)

## 2022-05-30 LAB — URINALYSIS, ROUTINE W REFLEX MICROSCOPIC
Bilirubin Urine: NEGATIVE
Glucose, UA: NEGATIVE mg/dL
Ketones, ur: NEGATIVE mg/dL
Nitrite: NEGATIVE
Protein, ur: 100 mg/dL — AB
RBC / HPF: >50 RBC/hpf — ABNORMAL HIGH (ref 0–5)
Specific Gravity, Urine: 1.009 (ref 1.005–1.030)
WBC, UA: >50 WBC/hpf — ABNORMAL HIGH (ref 0–5)
pH: 5.5 (ref 5.0–8.0)

## 2022-05-30 LAB — HEPATITIS PANEL, ACUTE
HCV Ab: NONREACTIVE
Hep A IgM: NONREACTIVE
Hep B C IgM: NONREACTIVE
Hepatitis B Surface Ag: NONREACTIVE

## 2022-05-30 MED ORDER — FENTANYL CITRATE PF 50 MCG/ML IJ SOSY: 12.5000 ug | PREFILLED_SYRINGE | INTRAMUSCULAR | Status: DC | PRN

## 2022-05-30 MED ORDER — LACTATED RINGERS IV BOLUS
1000.0000 mL | Freq: Once | INTRAVENOUS | Status: AC
  Administered 2022-05-30: 1000 mL via INTRAVENOUS

## 2022-05-30 MED ORDER — LACTATED RINGERS IV SOLN: INTRAVENOUS | Status: DC

## 2022-05-30 MED ORDER — MORPHINE SULFATE (PF) 4 MG/ML IV SOLN
6.0000 mg | Freq: Once | INTRAVENOUS | Status: AC
  Administered 2022-05-30: 6 mg via INTRAVENOUS
  Filled 2022-05-30: qty 2

## 2022-05-30 MED ORDER — PANTOPRAZOLE SODIUM 40 MG PO TBEC
40.0000 mg | DELAYED_RELEASE_TABLET | Freq: Every day | ORAL | Status: DC
  Administered 2022-05-30 – 2022-06-02 (×4): 40 mg via ORAL
  Filled 2022-05-30 (×4): qty 1

## 2022-05-30 MED ORDER — HEPARIN SODIUM (PORCINE) 5000 UNIT/ML IJ SOLN: 5000.0000 [IU] | Freq: Three times a day (TID) | INTRAMUSCULAR | Status: DC

## 2022-05-30 MED ORDER — ONDANSETRON HCL 4 MG PO TABS: 4.0000 mg | ORAL_TABLET | Freq: Four times a day (QID) | ORAL | Status: DC | PRN

## 2022-05-30 MED ORDER — ONDANSETRON HCL 4 MG/2ML IJ SOLN
4.0000 mg | Freq: Once | INTRAMUSCULAR | Status: AC
Start: 2022-05-30 — End: 2022-05-30
  Administered 2022-05-30: 4 mg via INTRAVENOUS
  Filled 2022-05-30: qty 2

## 2022-05-30 MED ORDER — ONDANSETRON HCL 4 MG/2ML IJ SOLN: 4.0000 mg | Freq: Four times a day (QID) | INTRAMUSCULAR | Status: DC | PRN

## 2022-05-30 MED ORDER — OXYCODONE HCL 5 MG PO TABS: 5.0000 mg | ORAL_TABLET | ORAL | Status: DC | PRN

## 2022-05-30 MED ORDER — CHLORHEXIDINE GLUCONATE CLOTH 2 % EX PADS
6.0000 | MEDICATED_PAD | Freq: Every day | CUTANEOUS | Status: DC
Start: 2022-05-30 — End: 2022-06-03
  Administered 2022-05-30 – 2022-06-02 (×4): 6 via TOPICAL

## 2022-05-30 MED ORDER — ISOSORBIDE MONONITRATE ER 60 MG PO TB24
90.0000 mg | ORAL_TABLET | Freq: Every day | ORAL | Status: DC
  Administered 2022-05-30 – 2022-06-02 (×4): 90 mg via ORAL
  Filled 2022-05-30 (×4): qty 1

## 2022-05-30 MED ORDER — POLYETHYLENE GLYCOL 3350 17 G PO PACK
17.0000 g | PACK | Freq: Every day | ORAL | Status: DC | PRN
  Administered 2022-06-01: 17 g via ORAL
  Filled 2022-05-30: qty 1

## 2022-05-30 MED ORDER — TRAMADOL HCL 50 MG PO TABS: 50.0000 mg | ORAL_TABLET | Freq: Three times a day (TID) | ORAL | Status: DC | PRN

## 2022-05-30 NOTE — ED Notes (Signed)
Patient Soiled Brief and Linens were changed by this RN. Perineal Area cleansed and Sheets/Brief replaced. Patient adjusted in Bed and Comfortable at this Time. Call [place] [facility] Reach.

## 2022-05-30 NOTE — ED Provider Notes (Signed)
[facility] EMERGENCY DEPT Provider Note   CSN: [id number] Arrival date & time: 05/30/22  1056     History  Chief Complaint  Patient presents with   Back Pain    Thomas Farrell is a 81 y.o. male.  81 year old male presents with several days of low back pain.  Patient denies any history of direct trauma to his back.  States he normally walks unassisted.  Does have a history of osteoarthritis in his lumbar spine.  Notes that he has had trouble with moving his bowels when he urinates and that he feels as if he does not empty his bladder fully.  Denies any perineal numbness or tingling.  No radicular symptoms from his back pain currently.  Has had not much oral intake.  Denies any abdominal pain but has had some watery loose stools.       Home Medications Prior to Admission medications   Medication Sig Start Date End Date Taking? Authorizing Provider  amLODipine (NORVASC) 5 MG tablet TAKE ONE TABLET BY MOUTH ONCE DAILY 02/18/22   Jerline Pain, MD  carvedilol (COREG) 3.125 MG tablet TAKE ONE TABLET BY MOUTH TWICE DAILY 02/18/22   Jerline Pain, MD  clopidogrel (PLAVIX) 75 MG tablet TAKE ONE TABLET BY MOUTH ONCE DAILY 11/19/21   Jerline Pain, MD  co-enzyme Q-10 30 MG capsule Take 200 mg by mouth daily.    [provider]  ezetimibe (ZETIA) 10 MG tablet TAKE ONE TABLET BY MOUTH DAILY 02/18/22   Jerline Pain, MD  hydrochlorothiazide (MICROZIDE) 12.5 MG capsule TAKE ONE CAPSULE BY MOUTH ONCE DAILY 05/16/22   Jerline Pain, MD  isosorbide mononitrate (IMDUR) 60 MG 24 hr tablet TAKE 1 AND 1/2 TABLETS BY MOUTH ONCE DAILY 02/18/22   Jerline Pain, MD  lisinopril (ZESTRIL) 40 MG tablet TAKE ONE TABLET BY MOUTH ONCE DAILY 02/18/22   Jerline Pain, MD  nitroGLYCERIN (NITROSTAT) 0.4 MG SL tablet Place 1 tablet (0.4 mg total) under the tongue every 5 (five) minutes as needed for chest pain (MAX 3 DOSES). 07/06/20   Burtis Junes, NP  pantoprazole (PROTONIX) 40 MG  tablet Take 40 mg by mouth daily.    [provider]  rosuvastatin (CRESTOR) 40 MG tablet TAKE ONE TABLET BY MOUTH DAILY 02/18/22   Jerline Pain, MD      Allergies    Acetaminophen and Niacin    Review of Systems   Review of Systems  All other systems reviewed and are negative.   Physical Exam Updated Vital Signs BP 99/71   Pulse (!) 52   Temp 98.1 F (36.7 C)   Resp 15   Ht 1.702 m ('5\' 7"'$ )   Wt 85.7 kg   SpO2 97%   BMI 29.60 kg/m  Physical Exam Vitals and nursing note reviewed.  Constitutional:      General: He is not in acute distress.    Appearance: Normal appearance. He is well-developed. He is not toxic-appearing.  HENT:     Head: Normocephalic and atraumatic.  Eyes:     General: Lids are normal.     Conjunctiva/sclera: Conjunctivae normal.     Pupils: Pupils are equal, round, and reactive to light.  Neck:     Thyroid: No thyroid mass.     Trachea: No tracheal deviation.  Cardiovascular:     Rate and Rhythm: Normal rate and regular rhythm.     Heart sounds: Normal heart sounds. No murmur heard.  No gallop.  Pulmonary:     Effort: Pulmonary effort is normal. No respiratory distress.     Breath sounds: Normal breath sounds. No stridor. No decreased breath sounds, wheezing, rhonchi or rales.  Abdominal:     General: There is no distension.     Palpations: Abdomen is soft.     Tenderness: There is no abdominal tenderness. There is no rebound.  Musculoskeletal:        General: No tenderness. Normal range of motion.     Cervical back: Normal range of motion and neck supple.     Comments: Nontender along lumbar spine  Skin:    General: Skin is warm and dry.     Findings: No abrasion or rash.  Neurological:     General: No focal deficit present.     Mental Status: He is alert and oriented to person, place, and time. Mental status is at baseline.     GCS: GCS eye subscore is 4. GCS verbal subscore is 5. GCS motor subscore is 6.     Cranial Nerves:  No cranial nerve deficit.     Sensory: No sensory deficit.     Motor: Motor function is intact.     Comments: Strength is 5 of 5 in lower extremities bilaterally.  Normal plantar and dorsiflexion  Psychiatric:        Attention and Perception: Attention normal.        Speech: Speech normal.        Behavior: Behavior normal.     ED Results / Procedures / Treatments   Labs (all labs ordered are listed, but only abnormal results are displayed) Labs Reviewed  URINALYSIS, ROUTINE W REFLEX MICROSCOPIC    EKG None  Radiology No results found.  Procedures Procedures    Medications Ordered in ED Medications  lactated ringers bolus 1,000 mL (has no administration in time range)  lactated ringers infusion (has no administration in time range)  ondansetron (ZOFRAN) injection 4 mg (has no administration in time range)  morphine (PF) 4 MG/ML injection 6 mg (has no administration in time range)    ED Course/ Medical Decision Making/ A&P                           Medical Decision Making Amount and/or Complexity of Data Reviewed Labs: ordered. Radiology: ordered.  Risk Prescription drug management.   Patient is clinically dehydrated here.  Patient given 2 L of lactated Ringer's.  Patient has evidence of acute kidney injury with elevated BUN and creatinine.  Patient had been complaining of low back pain and CT LS spine was negative.  Concern for possible cauda equina but feel less likely.  Has normal reflexes.  Has 5 out of 5 strength.  Patient has elevated transaminases.  He does not have any abdominal pain.  We will add hepatitis panel as he has had diarrhea.  Family notes the patient has had decreased oral intake which I think explains why the BUN/creatinine are still high.  Patient does complain of some back pain will likely need to have MRI which we cannot do here.  Plan will be for hospitalist admission        Final Clinical Impression(s) / ED Diagnoses Final diagnoses:   None    Rx / DC Orders ED Discharge Orders     None         Lacretia Leigh, MD 05/30/22 1402

## 2022-05-30 NOTE — ED Triage Notes (Signed)
Pt reports constant lower back pain for about 1 week. He states that he has the sensation to urinate but doesn't feel like he has been emptying his bladder. He has also had moments where he lost control of his bowel and would have a bm without knowing it.

## 2022-05-30 NOTE — ED Notes (Signed)
[facility] at the Bedside. 

## 2022-05-30 NOTE — H&P (Signed)
ADMISSION HISTORY AND PHYSICAL  Thomas Farrell WNU:[id number] DOB: October 02, 1941 DOA: 05/30/2022  PCP: Lawerance Cruel, MD Patient coming from: Home via [facility]   Chief Complaint: Difficulty urinating and low back pain  HPI:  81yo w/ a hx of HTN, CAD, GERD, HLD, and spinal stenosis who presented w/ a 2-week history of progressively worsening difficulty urinating marked by dysuria, difficulty initiating stream, low stream volume, and a sense of incomplete bladder emptying.  After 1 week of this he then began to notice low back pain.  At times he would strain hard to pass his urine and inadvertently defecate, but detailed history reveals no true episodes of unpredicted unprovoked bowel incontinence.  Over a 3 to 4-day period the patient's family began to notice that he was lethargic and mildly confused.  When his symptoms fail to improve they implored him to seek assistance.  In the ER a CT OF THE L spine noted no acute spinal abnormalities abnormalites and degenerative change not significantly changed since MRI in 2021, but did reveal a markedly enlarged prostate. Labs noted transaminitis and markedly elevated elevated creatinine and BUN. Exam in the ED noted 5/5 strength of B LE and normal reflexes. There was no perineal numbness.  At the time of my evaluation the patient is alert and oriented.  He denies chest pain or shortness of breath.  He reports some nausea but no vomiting.  He admits to a very poor appetite worsening over 2 weeks.  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 Likely due to bladder outlet obstruction due to marked prostate enlargement -suspect this has been progressive which is why the patient is able to tolerate a remarkably elevated BUN - baseline creatinine 1.22 June 2021 -Foley catheter to be replaced ASAP to assure complete decompression of the urinary system -renal ultrasound to rule out obstruction higher up -monitor renal function in serial  fashion  Severe uremia Place patient on telemetry -no rub on physical exam -patient is remarkably alert and conversant though family is able to appreciate that he is mildly confused -suspect this has slowly been building over 2 weeks given the history -hydrate and monitor  Markedly enlarged prostate Noted on CT lumbar spine -suspect this is leading to bladder outlet obstruction and to blame for the majority of his presenting symptoms -asked nurse to place Foley catheter -if nurse is not able to place a catheter we will need to request urology assistance as full decompression of the urinary tract is very important  Transaminitis  Etiology not clear -perhaps due to retained medications in the setting of acute renal failure -hydrate and follow -check viral hepatitis panel -patient denies alcohol abuse  Hyponatremia Likely simply volume related/due to hypovolemia -hydrate and follow  Hyperglycemia  Serum glucose elevated at 173 -no prior history of diabetes -likely simply due to acute illness -check A1c  CAD S/P PTCA/DES to RCA 2013 - followed by Dr. Marlou Porch -no symptoms concerning for angina  HLD Hold medical therapy in setting of transaminitis  HTN Blood pressure reasonably controlled -avoid hypotension in setting of acute kidney failure -monitor without home medications for now   DVT prophylaxis: SCDs Code Status: Full Family Communication: Spoke to multiple family members at bedside Disposition Plan:  Admit to Inpatient  Consults called: none indicated  Review of Systems: As per HPI otherwise 10 point review of systems negative.   Past Medical History:  Diagnosis Date   CAD in native artery    a. 2009 -  s/p PTCA/DES to RCA with wire-induced dissection in distal RCA. b. 2013 - s/p DES to dRCA and cutting balloon angioplasty to ISR to mRCA   Cancer of skin of temple    "right"   Cardiomyopathy ([place])    a. EF 45-50% in 2009. b. 50-55% by echo 04/2016.   CKD (chronic kidney  disease), stage III (HCC)    GERD (gastroesophageal reflux disease)    Hyperlipidemia    Hypertension    Myocardial infarction [facility]) 2009   Obesity    Osteoarthritis     Past Surgical History:  Procedure Laterality Date   CARDIAC CATHETERIZATION  2009;  12/2011   CORONARY ANGIOPLASTY WITH STENT PLACEMENT  2009; 01/2012   "~ 2wk after each cath"   INGUINAL HERNIA REPAIR Right ~ 2010   [place] (PCI-S) N/A 01/25/2012   Procedure: PERCUTANEOUS CORONARY STENT INTERVENTION (PCI-S);  Surgeon: Jettie Booze, MD;  Location: [facility] C[facility] LAB;  Service: Cardiovascular;  Laterality: N/A;   SKIN CANCER EXCISION Right    temple   TONSILLECTOMY      Family History  Family History  Problem Relation Age of Onset   Heart attack Father    Hypertension Other    Heart attack Brother    Heart attack Paternal Uncle    Stroke Neg Hx     Social History   reports that he quit smoking about 20 years ago. His smoking use included cigarettes. He has a 172.00 pack-year smoking history. He has never used smokeless tobacco. He reports current alcohol use of about 14.0 standard drinks of alcohol per week. He reports that he does not use drugs. 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 He lives in [place], and has multiple family members here.   Allergies Allergies  Allergen Reactions   Acetaminophen Diarrhea and Nausea And Vomiting   Niacin Other (See Comments)    flushing    Prior to Admission medications   Medication Sig Start Date End Date Taking? Authorizing Provider  amLODipine (NORVASC) 5 MG tablet TAKE ONE TABLET BY MOUTH ONCE DAILY 02/18/22   Jerline Pain, MD  carvedilol (COREG) 3.125 MG tablet TAKE ONE TABLET BY MOUTH TWICE DAILY 02/18/22   Jerline Pain, MD  clopidogrel (PLAVIX) 75 MG tablet TAKE ONE TABLET BY MOUTH ONCE DAILY 11/19/21   Jerline Pain, MD  co-enzyme Q-10 30 MG capsule Take 200 mg by mouth daily.     [provider]  ezetimibe (ZETIA) 10 MG tablet TAKE ONE TABLET BY MOUTH DAILY 02/18/22   Jerline Pain, MD  hydrochlorothiazide (MICROZIDE) 12.5 MG capsule TAKE ONE CAPSULE BY MOUTH ONCE DAILY 05/16/22   Jerline Pain, MD  isosorbide mononitrate (IMDUR) 60 MG 24 hr tablet TAKE 1 AND 1/2 TABLETS BY MOUTH ONCE DAILY 02/18/22   Jerline Pain, MD  lisinopril (ZESTRIL) 40 MG tablet TAKE ONE TABLET BY MOUTH ONCE DAILY 02/18/22   Jerline Pain, MD  nitroGLYCERIN (NITROSTAT) 0.4 MG SL tablet Place 1 tablet (0.4 mg total) under the tongue every 5 (five) minutes as needed for chest pain (MAX 3 DOSES). 07/06/20   Burtis Junes, NP  pantoprazole (PROTONIX) 40 MG tablet Take 40 mg by mouth daily.    [provider]  rosuvastatin (CRESTOR) 40 MG tablet TAKE ONE TABLET BY MOUTH DAILY 02/18/22   Jerline Pain, MD    Physical Exam: Vitals:   05/30/22 1345 05/30/22 1600 05/30/22 1625  05/30/22 1714  BP: 110/72 120/78  134/85  Pulse: 87 (!) 58  75  Resp: '16 20  18  '$ Temp:   98 F (36.7 C) (!) 97.5 F (36.4 C)  TempSrc:   Oral   SpO2: 93% 95%  100%  Weight:      Height:        Constitutional: NAD, calm, comfortable Eyes: PERRL, lids and conjunctivae normal ENMT: Mucous membranes are dry - Posterior pharynx clear of any exudate or lesions. Normal dentition.  Neck: normal, supple, no masses, no thyromegaly Respiratory: clear to auscultation bilaterally, no wheezing, no crackles. Normal respiratory effort. No accessory muscle use.  Cardiovascular: Regular rate with frequent ectopic beats without murmur or rub Abdomen: Protuberant but at baseline per patient and family, no mass, mildly tender to deep palpation diffusely, no rebound, bowel sounds positive Musculoskeletal: No clubbing / cyanosis. No joint deformity upper and lower extremities. No contractures. Normal muscle tone.  Skin: No rashes, lesions, ulcers.  Neurologic: CN 2-12 grossly intact B. Sensation intact. Strength 5/5 in all  4 extremities.  No Babinski Psychiatric: Normal judgment and insight. Alert and oriented x 3. Normal mood.  Affect somewhat flat.   Labs on Admission:   CBC: Recent Labs  Lab 05/30/22 1141  WBC 9.8  NEUTROABS 7.7  HGB 13.1  HCT 37.9*  MCV 85.0  PLT 767   Basic Metabolic Panel: Recent Labs  Lab 05/30/22 1141  NA 131*  K 3.6  CL 94*  CO2 20*  GLUCOSE 173*  BUN 119*  CREATININE 4.54*  CALCIUM 9.0   GFR: Estimated Creatinine Clearance: 13.3 mL/min (A) (by C-G formula based on SCr of 4.54 mg/dL (H)).  Liver Function Tests: Recent Labs  Lab 05/30/22 1141  AST 499*  ALT 272*  ALKPHOS 110  BILITOT 0.7  PROT 6.7  ALBUMIN 3.4*    Urine analysis:    Component Value Date/Time   COLORURINE ORANGE (A) 05/30/2022 1141   APPEARANCEUR HAZY (A) 05/30/2022 1141   LABSPEC 1.009 05/30/2022 1141   PHURINE 5.5 05/30/2022 1141   GLUCOSEU NEGATIVE 05/30/2022 1141   HGBUR LARGE (A) 05/30/2022 1141   BILIRUBINUR NEGATIVE 05/30/2022 1141   KETONESUR NEGATIVE 05/30/2022 1141   PROTEINUR 100 (A) 05/30/2022 1141   NITRITE NEGATIVE 05/30/2022 1141   LEUKOCYTESUR LARGE (A) 05/30/2022 1141    Radiological Exams on Admission: CT Lumbar Spine Wo Contrast  Result Date: 05/30/2022 CLINICAL DATA:  Low back pain, increased fracture risk EXAM: CT LUMBAR SPINE WITHOUT CONTRAST TECHNIQUE: Multidetector CT imaging of the lumbar spine was performed without intravenous contrast administration. Multiplanar CT image reconstructions were also generated. RADIATION DOSE REDUCTION: This exam was performed according to the departmental dose-optimization program which includes automated exposure control, adjustment of the mA and/or kV according to patient size and/or use of iterative reconstruction technique. COMPARISON:  MRI November 2021 FINDINGS: Segmentation: 5 lumbar type vertebrae. Alignment: Stable with mild degenerative listhesis. Vertebrae: Stable vertebral body heights with degenerative endplate  irregularity. No acute fracture. Paraspinal and other soft tissues: Bladder wall thickening. Partially imaged fat infiltration in the pelvis. Markedly enlarged prostate. Atherosclerosis. Disc levels: Multilevel disc space narrowing with disc bulges, endplate osteophytes, and vacuum phenomenon. Multilevel facet hypertrophy with ligamentum flavum thickening. Appearance is similar to 2021 MRI. IMPRESSION: No acute osseous abnormality. Degenerative changes appear similar to 2021 MRI. Bladder wall thickening probably secondary to chronic outlet obstruction from markedly enlarged prostate. Indeterminate partially imaged fat infiltration in the pelvis that may reflect acute inflammation. Electronically  Signed   By: Macy Mis M.D.   On: 05/30/2022 12:02     Cherene Altes, MD [facility] Office  [phone] Pager - Text Page per Amion as per below:  On-Call/Text Page:      [email or web address]  If 7PM-7AM, please contact night-coverage [email or web address] 05/30/2022, 5:23 PM

## 2022-05-30 NOTE — ED Notes (Signed)
Patient Transported to CT at this Time.

## 2022-05-30 NOTE — Plan of Care (Signed)
[place]: Psychologist, sport and exercise Requesting Physician/APP: Lacretia Leigh, MD  History: 25M history of HTN, CAD, GERD, HLD, spinal stenosis initially presented to [facility] [place] with 1 week of progressive lower back pain, generalized weakness, difficulty ambulating and a feeling like unable to empty his bladder.  Upon work-up at med center he was noted to have an elevated creatinine, elevated transaminases.  Patient underwent CT L-spine without contrast with no acute abnormality with degenerative changes similar to appearance of MRI in 2021.  Per ED physician, no concern for cauda equina syndrome but requesting admission for further work-up of elevated LFTs, acute renal failure and requested consideration of MR L-spine for further work-up of his back pain.  Plan of Care:  -- Admit to Med Surg -- Requested bladder scan and insertion of Foley catheter if significant urinary obstruction noted  [facility] will assume care on arrival to accepting facility. Until arrival, care as per EDP. However, [facility] available 24/7 for questions and assistance.   Please page [place] and Consults [phone]) as soon as the patient arrives to the hospital.   Cohen Boettner [place] DO

## 2022-05-30 NOTE — ED Notes (Signed)
Care Handoff/Report given to [facility] RN at this Time. All Questions Answered.

## 2022-05-30 NOTE — ED Notes (Signed)
Care Handoff/Report given to [facility] at this Time. All Questions Answered.

## 2022-05-30 NOTE — ED Notes (Signed)
Patient made aware of Need for Urine Specimen. Will attempt to provide Sample shortly.

## 2022-05-31 DIAGNOSIS — N320 Bladder-neck obstruction: Secondary | ICD-10-CM | POA: Diagnosis not present

## 2022-05-31 DIAGNOSIS — N171 Acute kidney failure with acute cortical necrosis: Secondary | ICD-10-CM | POA: Diagnosis not present

## 2022-05-31 LAB — CBC
HCT: 37.5 % — ABNORMAL LOW (ref 39.0–52.0)
Hemoglobin: 12.7 g/dL — ABNORMAL LOW (ref 13.0–17.0)
MCH: 29.5 pg (ref 26.0–34.0)
MCHC: 33.9 g/dL (ref 30.0–36.0)
MCV: 87 fL (ref 80.0–100.0)
Platelets: 223 10*3/uL (ref 150–400)
RBC: 4.31 MIL/uL (ref 4.22–5.81)
RDW: 14.9 % (ref 11.5–15.5)
WBC: 11.2 10*3/uL — ABNORMAL HIGH (ref 4.0–10.5)
nRBC: 0 % (ref 0.0–0.2)

## 2022-05-31 LAB — MAGNESIUM: Magnesium: 2.3 mg/dL (ref 1.7–2.4)

## 2022-05-31 LAB — HEMOGLOBIN A1C
Hgb A1c MFr Bld: 6.4 % — ABNORMAL HIGH (ref 4.8–5.6)
Mean Plasma Glucose: 136.98 mg/dL

## 2022-05-31 LAB — COMPREHENSIVE METABOLIC PANEL
ALT: 215 U/L — ABNORMAL HIGH (ref 0–44)
AST: 278 U/L — ABNORMAL HIGH (ref 15–41)
Albumin: 2.4 g/dL — ABNORMAL LOW (ref 3.5–5.0)
Alkaline Phosphatase: 95 U/L (ref 38–126)
Anion gap: 12 (ref 5–15)
BUN: 109 mg/dL — ABNORMAL HIGH (ref 8–23)
CO2: 23 mmol/L (ref 22–32)
Calcium: 8.2 mg/dL — ABNORMAL LOW (ref 8.9–10.3)
Chloride: 102 mmol/L (ref 98–111)
Creatinine, Ser: 3.54 mg/dL — ABNORMAL HIGH (ref 0.61–1.24)
GFR, Estimated: 17 mL/min — ABNORMAL LOW (ref >60)
Glucose, Bld: 120 mg/dL — ABNORMAL HIGH (ref 70–99)
Potassium: 3.7 mmol/L (ref 3.5–5.1)
Sodium: 137 mmol/L (ref 135–145)
Total Bilirubin: 0.8 mg/dL (ref 0.3–1.2)
Total Protein: 5.8 g/dL — ABNORMAL LOW (ref 6.5–8.1)

## 2022-05-31 LAB — HEPATITIS PANEL, ACUTE
HCV Ab: NONREACTIVE
Hep A IgM: NONREACTIVE
Hep B C IgM: NONREACTIVE
Hepatitis B Surface Ag: NONREACTIVE

## 2022-05-31 LAB — AMMONIA: Ammonia: 21 umol/L (ref 9–35)

## 2022-05-31 LAB — PHOSPHORUS: Phosphorus: 5.2 mg/dL — ABNORMAL HIGH (ref 2.5–4.6)

## 2022-05-31 MED ORDER — CLOPIDOGREL BISULFATE 75 MG PO TABS
75.0000 mg | ORAL_TABLET | Freq: Every day | ORAL | Status: DC
  Administered 2022-05-31 – 2022-06-02 (×3): 75 mg via ORAL
  Filled 2022-05-31 (×3): qty 1

## 2022-05-31 MED ORDER — BETHANECHOL CHLORIDE 10 MG PO TABS
10.0000 mg | ORAL_TABLET | Freq: Three times a day (TID) | ORAL | Status: DC
  Administered 2022-05-31 – 2022-06-02 (×6): 10 mg via ORAL
  Filled 2022-05-31 (×7): qty 1

## 2022-05-31 MED ORDER — TERAZOSIN HCL 1 MG PO CAPS
1.0000 mg | ORAL_CAPSULE | Freq: Every day | ORAL | Status: AC
Start: 2022-05-31 — End: 2022-06-01
  Administered 2022-05-31 – 2022-06-01 (×2): 1 mg via ORAL
  Filled 2022-05-31 (×2): qty 1

## 2022-05-31 MED ORDER — TERAZOSIN HCL 2 MG PO CAPS
2.0000 mg | ORAL_CAPSULE | Freq: Every day | ORAL | Status: DC
  Filled 2022-05-31: qty 1

## 2022-05-31 MED ORDER — HEPARIN SODIUM (PORCINE) 5000 UNIT/ML IJ SOLN
5000.0000 [IU] | Freq: Three times a day (TID) | INTRAMUSCULAR | Status: DC
  Administered 2022-05-31 – 2022-06-02 (×7): 5000 [IU] via SUBCUTANEOUS
  Filled 2022-05-31 (×7): qty 1

## 2022-05-31 NOTE — Progress Notes (Signed)
Thomas Farrell  EYC:[id number] DOB: Aug 28, 1941 DOA: 05/30/2022 PCP: Lawerance Cruel, MD    Brief Narrative:  [phone] w/ a hx of HTN, CAD, GERD, HLD, and spinal stenosis who presented w/ a 2-week history of progressively worsening difficulty urinating marked by dysuria, difficulty initiating stream, low stream volume, and a sense of incomplete bladder emptying.  After 1 week of this he then began to notice low back pain.  At times he would strain hard to pass his urine and inadvertently defecate, but detailed history reveals no true episodes of unpredicted unprovoked bowel incontinence.  Over a 3 to 4-day period the patient's family began to notice that he was lethargic and mildly confused.  When his symptoms fail to improve they implored him to seek assistance.  In the ER a CT OF THE L spine noted no acute spinal abnormalities abnormalites and degenerative change not significantly changed since MRI in 2021, but did reveal a markedly enlarged prostate. Labs noted transaminitis and markedly elevated elevated creatinine and BUN. Exam in the ED noted 5/5 strength of B LE and normal reflexes. There was no perineal numbness.    Consultants:  None  Goals of Care:  Code Status: Full Code   DVT prophylaxis: SCDs  Interim Hx: Afebrile.  Heart rate mildly tachycardic at 89-119.  Blood pressure 02-637 systolic.  Creatinine is improving nicely.  BUN has declined as well.  LFTs also trending downward.  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 Likely due to bladder outlet obstruction due to marked prostate enlargement -suspect this has been progressive which is why the patient is able to tolerate a remarkably elevated BUN - baseline creatinine 1.22 June 2021 -Foley catheter placed with 1500 cc of urine released - renal ultrasound without evidence of obstruction or hydronephrosis status post Foley catheter placement    Severe uremia no rub on physical exam -patient was remarkably alert  and conversant though family is able to appreciate that he is mildly confused -suspect this has slowly been building over 2 weeks given the history -trending downward with ongoing hydration   BPH/Markedly enlarged prostate - BOO Noted on CT lumbar spine -suspect this was leading to bladder outlet obstruction and to blame for the majority of his presenting symptoms -Foley catheter in place -initiate Hytrin -will need to further titrate upward if tolerates -add Urecholine short course -consider voiding trial prior to discharge versus keeping Foley catheter in place and outpatient referral to urology   Transaminitis  perhaps due to retained medications in the setting of acute renal failure - viral hepatitis panel negative-patient denies alcohol abuse -LFTs improving   Hyponatremia Likely simply volume related/due to hypovolemia -corrected with volume resuscitation  Newly diagnosed atrial fibrillation Rhythm irregular on auscultation -EKG consistent with atrial fibrillation -rate controlled at present -patient reports being told 1 time a long time ago that he had possible atrial fibrillation -for now we have agreed to monitor this as it could have been brought out by acute distress/electrolyte abnormalities -the patient is not too keen on the idea of initiating anticoagulation, but if his atrial fibrillation persists or even more so if it proves to be paroxysmal this may be appropriate to minimize the risk of stroke -we have agreed to discuss this further as the hospital course progresses   Hyperglycemia  no prior history of diabetes - A1c 6.4 therefore not yet meeting criteria for DM   CAD S/P PTCA/DES to RCA 2013 - followed by Dr. Marlou Porch -  no symptoms concerning for angina   HLD Hold medical therapy in setting of transaminitis   HTN Blood pressure reasonably controlled -avoid hypotension in setting of acute kidney failure -monitor without home medications for now   Family Communication: Spoke  with patient and multiple family members at bedside Disposition: From home -anticipate eventual return home -begin to ambulate -up in chair -PT/OT evaluations   Objective: Blood pressure 115/68, pulse (!) 103, temperature 98 F (36.7 C), temperature source Oral, resp. rate 18, height '5\' 7"'$  (1.702 m), weight 85.7 kg, SpO2 97 %.  Intake/Output Summary (Last 24 hours) at 05/31/2022 1000 Last data filed at 05/31/2022 0944 Gross per 24 hour  Intake 1608.36 ml  Output 1525 ml  Net 83.36 ml   Filed Weights   05/30/22 1111  Weight: 85.7 kg    Examination: General: No acute respiratory distress Lungs: Clear to auscultation bilaterally without wheezes or crackles Cardiovascular: Irregularly irregular without murmur or wheeze Abdomen: Nontender, nondistended, soft, bowel sounds positive, no rebound, no ascites, no appreciable mass Extremities: No significant cyanosis, clubbing, or edema bilateral lower extremities  CBC: Recent Labs  Lab 05/30/22 1141 05/31/22 0546  WBC 9.8 11.2*  NEUTROABS 7.7  --   HGB 13.1 12.7*  HCT 37.9* 37.5*  MCV 85.0 87.0  PLT 184 631   Basic Metabolic Panel: Recent Labs  Lab 05/30/22 1141 05/31/22 0546  NA 131* 137  K 3.6 3.7  CL 94* 102  CO2 20* 23  GLUCOSE 173* 120*  BUN 119* 109*  CREATININE 4.54* 3.54*  CALCIUM 9.0 8.2*  MG  --  2.3  PHOS  --  5.2*   GFR: Estimated Creatinine Clearance: 17.1 mL/min (A) (by C-G formula based on SCr of 3.54 mg/dL (H)).  Liver Function Tests: Recent Labs  Lab 05/30/22 1141 05/31/22 0546  AST 499* 278*  ALT 272* 215*  ALKPHOS 110 95  BILITOT 0.7 0.8  PROT 6.7 5.8*  ALBUMIN 3.4* 2.4*    Recent Labs  Lab 05/31/22 0546  AMMONIA 21    HbA1C: Hgb A1c MFr Bld  Date/Time Value Ref Range Status  05/31/2022 05:46 AM 6.4 (H) 4.8 - 5.6 % Final    Comment:    (NOTE) Pre diabetes:          5.7%-6.4%  Diabetes:              >6.4%  Glycemic control for   <7.0% adults with diabetes     Scheduled  Meds:  Chlorhexidine Gluconate Cloth  6 each Topical Daily   isosorbide mononitrate  90 mg Oral Daily   pantoprazole  40 mg Oral Daily   Continuous Infusions:  lactated ringers 125 mL/hr at 05/30/22 1322     LOS: 1 day   Cherene Altes, MD [facility] Office  [phone] Pager - Text Page per Amion  If 7PM-7AM, please contact night-coverage per Amion 05/31/2022, 10:00 AM

## 2022-05-31 NOTE — Plan of Care (Signed)

## 2022-05-31 NOTE — Progress Notes (Signed)
  Transition of Care [facility]) Screening Note   Patient Details  Name: Thomas Farrell Date of Birth: [birth year]   [facility] of [facility] CM/SW Contact:    Vassie Moselle, LCSW Phone Number: 05/31/2022, 10:54 AM    Transition of Care Department [facility]) has reviewed patient and no TOC needs have been identified at this time. We will continue to monitor patient advancement through interdisciplinary progression rounds. If new patient transition needs arise, please place a TOC consult.

## 2022-06-01 ENCOUNTER — Inpatient Hospital Stay (HOSPITAL_COMMUNITY): Payer: Medicare HMO

## 2022-06-01 DIAGNOSIS — N179 Acute kidney failure, unspecified: Secondary | ICD-10-CM

## 2022-06-01 LAB — COMPREHENSIVE METABOLIC PANEL
ALT: 248 U/L — ABNORMAL HIGH (ref 0–44)
AST: 301 U/L — ABNORMAL HIGH (ref 15–41)
Albumin: 2.4 g/dL — ABNORMAL LOW (ref 3.5–5.0)
Alkaline Phosphatase: 130 U/L — ABNORMAL HIGH (ref 38–126)
Anion gap: 10 (ref 5–15)
BUN: 88 mg/dL — ABNORMAL HIGH (ref 8–23)
CO2: 24 mmol/L (ref 22–32)
Calcium: 8.2 mg/dL — ABNORMAL LOW (ref 8.9–10.3)
Chloride: 107 mmol/L (ref 98–111)
Creatinine, Ser: 2.57 mg/dL — ABNORMAL HIGH (ref 0.61–1.24)
GFR, Estimated: 24 mL/min — ABNORMAL LOW (ref >60)
Glucose, Bld: 145 mg/dL — ABNORMAL HIGH (ref 70–99)
Potassium: 3.2 mmol/L — ABNORMAL LOW (ref 3.5–5.1)
Sodium: 141 mmol/L (ref 135–145)
Total Bilirubin: 0.9 mg/dL (ref 0.3–1.2)
Total Protein: 5.7 g/dL — ABNORMAL LOW (ref 6.5–8.1)

## 2022-06-01 LAB — CBC
HCT: 35.3 % — ABNORMAL LOW (ref 39.0–52.0)
Hemoglobin: 12.3 g/dL — ABNORMAL LOW (ref 13.0–17.0)
MCH: 29.9 pg (ref 26.0–34.0)
MCHC: 34.8 g/dL (ref 30.0–36.0)
MCV: 85.9 fL (ref 80.0–100.0)
Platelets: 251 10*3/uL (ref 150–400)
RBC: 4.11 MIL/uL — ABNORMAL LOW (ref 4.22–5.81)
RDW: 14.9 % (ref 11.5–15.5)
WBC: 12 10*3/uL — ABNORMAL HIGH (ref 4.0–10.5)
nRBC: 0 % (ref 0.0–0.2)

## 2022-06-01 LAB — TSH: TSH: 0.969 u[IU]/mL (ref 0.350–4.500)

## 2022-06-01 NOTE — Hospital Course (Signed)
81yo w/ a hx of HTN, CAD, GERD, HLD, and spinal stenosis who presented w/ a 2-week history of progressively worsening difficulty urinating marked by dysuria, difficulty initiating stream, low stream volume, and a sense of incomplete bladder emptying.  After 1 week of this he then began to notice low back pain.  At times he would strain hard to pass his urine and inadvertently defecate, but detailed history reveals no true episodes of unpredicted unprovoked bowel incontinence.  Over a 3 to 4-day period the patient's family began to notice that he was lethargic and mildly confused.  When his symptoms fail to improve they implored him to seek assistance.  In the ER a CT OF THE L spine noted no acute spinal abnormalities abnormalites and degenerative change not significantly changed since MRI in 2021, but did reveal a markedly enlarged prostate. Labs noted transaminitis and markedly elevated elevated creatinine and BUN. Exam in the ED noted 5/5 strength of B LE and normal reflexes. There was no perineal numbness.

## 2022-06-01 NOTE — Plan of Care (Signed)

## 2022-06-01 NOTE — Progress Notes (Signed)
Progress Note   Patient: Thomas Farrell:[id number] DOB: [birth year] DOA: 05/30/2022     2 DOS: the patient was seen and examined on 06/01/2022   Brief hospital course: 81yo w/ a hx of HTN, CAD, GERD, HLD, and spinal stenosis who presented w/ a 2-week history of progressively worsening difficulty urinating marked by dysuria, difficulty initiating stream, low stream volume, and a sense of incomplete bladder emptying.  After 1 week of this he then began to notice low back pain.  At times he would strain hard to pass his urine and inadvertently defecate, but detailed history reveals no true episodes of unpredicted unprovoked bowel incontinence.  Over a 3 to 4-day period the patient's family began to notice that he was lethargic and mildly confused.  When his symptoms fail to improve they implored him to seek assistance.  In the ER a CT OF THE L spine noted no acute spinal abnormalities abnormalites and degenerative change not significantly changed since MRI in 2021, but did reveal a markedly enlarged prostate. Labs noted transaminitis and markedly elevated elevated creatinine and BUN. Exam in the ED noted 5/5 strength of B LE and normal reflexes. There was no perineal numbness.    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 Likely due to bladder outlet obstruction due to marked prostate enlargement -suspect this has been progressive which is why the patient is able to tolerate a remarkably elevated BUN - baseline creatinine 1.22 June 2021  -Renal function improving with Foley catheter  - renal ultrasound without evidence of obstruction or hydronephrosis status post Foley catheter placement  -anticipate voiding trial in the next 24hrs -will need to f/u with Urology as outpatient   Severe uremia no rub was noted on physical exam  -Improving with hydration   BPH/Markedly enlarged prostate - BOO Noted on CT lumbar spine -suspect this was leading to bladder outlet obstruction and to  blame for the majority of his presenting symptoms -Foley catheter in place -initiate Hytrin -will need to further titrate upward if tolerates -added Urecholine short course  -consider voiding trial prior to discharge versus keeping Foley catheter in place and outpatient referral to urology   Transaminitis  perhaps due to retained medications in the setting of acute renal failure - viral hepatitis panel negative-patient denies alcohol abuse  -LFT's higher today. Have ordered RUQ [place]   Hyponatremia Likely simply volume related/due to hypovolemia -corrected with volume resuscitation   Newly diagnosed atrial fibrillation Rhythm irregular on auscultation -EKG consistent with atrial fibrillation -rate controlled at present -patient reports being told 1 time a long time ago that he had possible atrial fibrillation -for now we have agreed to monitor this as it could have been brought out by acute distress/electrolyte abnormalities  -Dr. Thereasa Solo discussed anticoagulation with pt, who was not too keen on the idea of initiating anticoagulation, but if his atrial fibrillation persists or even more so if it proves to be paroxysmal this may be appropriate to minimize the risk of stroke  -Would have pt f/u with Cardiology as outpatient   Hyperglycemia  -no prior history of diabetes - A1c 6.4 therefore not yet meeting criteria for DM   CAD S/P PTCA/DES to RCA 2013 - followed by Dr. Marlou Porch -no symptoms concerning for angina   HLD Hold medical therapy in setting of transaminitis   HTN Blood pressure reasonably controlled -avoid hypotension in setting of acute kidney failure -monitor without home medications for now       Subjective:  States feeling tired today  Physical Exam: Vitals:   05/31/22 1939 06/01/22 0433 06/01/22 0500 06/01/22 1430  BP: 114/65 135/75  140/68  Pulse: 97 79  72  Resp: '16 18  16  '$ Temp: 98.1 F (36.7 C) (!) 97.3 F (36.3 C)  98.1 F (36.7 C)  TempSrc: Oral Oral  Oral   SpO2: 96% 97%  97%  Weight:   84.8 kg   Height:       General exam: Awake, laying in bed, in nad Respiratory system: Normal respiratory effort, no wheezing Cardiovascular system: regular rate, s1, s2 Gastrointestinal system: Soft, nondistended, positive BS Central nervous system: CN2-12 grossly intact, strength intact Extremities: Perfused, no clubbing Skin: Normal skin turgor, no notable skin lesions seen Psychiatry: Mood normal // no visual hallucinations   Data Reviewed:  Labs reviewed:   Family Communication: Pt in room, family at bedside  Disposition: Status is: Inpatient Remains inpatient appropriate because: Severity of illness  Planned Discharge Destination: Home    Author: Marylu Lund, MD 06/01/2022 4:32 PM  For on call review [email or web address].

## 2022-06-02 ENCOUNTER — Other Ambulatory Visit (HOSPITAL_COMMUNITY): Payer: Self-pay

## 2022-06-02 DIAGNOSIS — N179 Acute kidney failure, unspecified: Secondary | ICD-10-CM | POA: Diagnosis not present

## 2022-06-02 LAB — COMPREHENSIVE METABOLIC PANEL
ALT: 170 U/L — ABNORMAL HIGH (ref 0–44)
AST: 140 U/L — ABNORMAL HIGH (ref 15–41)
Albumin: 2.2 g/dL — ABNORMAL LOW (ref 3.5–5.0)
Alkaline Phosphatase: 113 U/L (ref 38–126)
Anion gap: 8 (ref 5–15)
BUN: 60 mg/dL — ABNORMAL HIGH (ref 8–23)
CO2: 24 mmol/L (ref 22–32)
Calcium: 7.9 mg/dL — ABNORMAL LOW (ref 8.9–10.3)
Chloride: 110 mmol/L (ref 98–111)
Creatinine, Ser: 1.76 mg/dL — ABNORMAL HIGH (ref 0.61–1.24)
GFR, Estimated: 38 mL/min — ABNORMAL LOW (ref >60)
Glucose, Bld: 112 mg/dL — ABNORMAL HIGH (ref 70–99)
Potassium: 3.2 mmol/L — ABNORMAL LOW (ref 3.5–5.1)
Sodium: 142 mmol/L (ref 135–145)
Total Bilirubin: 0.8 mg/dL (ref 0.3–1.2)
Total Protein: 5.4 g/dL — ABNORMAL LOW (ref 6.5–8.1)

## 2022-06-02 MED ORDER — POTASSIUM CHLORIDE CRYS ER 20 MEQ PO TBCR
40.0000 meq | EXTENDED_RELEASE_TABLET | ORAL | Status: AC
  Administered 2022-06-02 (×2): 40 meq via ORAL
  Filled 2022-06-02 (×2): qty 2

## 2022-06-02 MED ORDER — BETHANECHOL CHLORIDE 10 MG PO TABS
10.0000 mg | ORAL_TABLET | Freq: Three times a day (TID) | ORAL | 0 refills | Status: AC
  Filled 2022-06-02 – 2022-06-03 (×2): qty 9, 3d supply, fill #0

## 2022-06-02 MED ORDER — TERAZOSIN HCL 2 MG PO CAPS
2.0000 mg | ORAL_CAPSULE | Freq: Every day | ORAL | 0 refills | Status: DC
Start: 2022-06-02 — End: 2022-08-28
  Filled 2022-06-02: qty 30, 30d supply, fill #0

## 2022-06-02 NOTE — Evaluation (Addendum)
Physical Therapy Evaluation Patient Details Name: Thomas Farrell MRN: [id number] DOB: 03/30/1941 Today's Date: 06/02/2022  History of Present Illness  81yo w/ a hx of HTN, CAD, GERD, HLD, and spinal stenosis who presented w/ a 2-week history of progressively worsening difficulty urinating marked by dysuria, difficulty initiating stream, low stream volume, and a sense of incomplete bladder emptying. Dx of acute renal failure, bladder obstruction 2* enlarged prostate.  Clinical Impression  Pt admitted with above diagnosis. At baseline pt ambulates without an assistive device, works out at [employer] several times/week, he denies h/o falls in past 6 months. Today he ambulated 250' with RW, no loss of balance. He did require min assist for supine to sit. Pt was unaware that he was soiled with large BM in the bed upon my arrival, he was able to stand unsupported for ~2 minutes for pericare.  He is mobilizing well enough to DC home. Pt currently with functional limitations due to the deficits listed below (see PT Problem List). Pt will benefit from skilled PT to increase their independence and safety with mobility to allow discharge to the venue listed below.          Recommendations for follow up therapy are one component of a multi-disciplinary discharge planning process, led by the attending physician.  Recommendations may be updated based on patient status, additional functional criteria and insurance authorization.  Follow Up Recommendations No PT follow up      Assistance Recommended at Discharge Set up Supervision/Assistance  Patient can return home with the following  Assist for transportation;Help with stairs or ramp for entrance;Assistance with cooking/housework    Equipment Recommendations None recommended by PT  Recommendations for Other Services       Functional Status Assessment Patient has had a recent decline in their functional status and demonstrates the ability to make  significant improvements in function in a reasonable and predictable amount of time.     Precautions / Restrictions Precautions Precautions: None Precaution Comments: no falls in past 6 months Restrictions Weight Bearing Restrictions: No      Mobility  Bed Mobility Overal bed mobility: Needs Assistance Bed Mobility: Supine to Sit     Supine to sit: Min assist     General bed mobility comments: assist to raise trunk    Transfers Overall transfer level: Modified independent Equipment used: None Transfers: Sit to/from Stand Sit to Stand: From elevated surface, Modified independent (Device/Increase time)           General transfer comment: no physical assist needed, increased time to rise from elevated bed    Ambulation/Gait Ambulation/Gait assistance: Supervision Gait Distance (Feet): 250 Feet Assistive device: Rolling walker (2 wheels) Gait Pattern/deviations: Step-through pattern Gait velocity: WFL     General Gait Details: steady with RW, no loss of balance, HR 90s, no dyspnea  Stairs            Wheelchair Mobility    Modified Rankin (Stroke Patients Only)       Balance Overall balance assessment: Modified Independent   Sitting balance-Leahy Scale: Good       Standing balance-Leahy Scale: Fair                               Pertinent Vitals/Pain Pain Assessment Pain Assessment: No/denies pain    Home Living Family/patient expects to be discharged to:: Private residence   Available Help at Discharge: Personal care attendant   Home  Access: Stairs to enter Entrance Stairs-Rails: Right Entrance Stairs-Number of Steps: 2   Home Layout: One level Home Equipment: Cane - single Barista (2 wheels);Shower seat Additional Comments: caregiver Antony Madura is with pt 24/7; pt has walk in tub    Prior Function Prior Level of Function : Independent/Modified Independent             Mobility Comments: no falls in past 6  months, walks without AD, works out at gym several times/week ADLs Comments: assist from caregiver     Hand Dominance        Extremity/Trunk Assessment   Upper Extremity Assessment Upper Extremity Assessment: Overall WFL for tasks assessed    Lower Extremity Assessment Lower Extremity Assessment: Overall WFL for tasks assessed    Cervical / Trunk Assessment Cervical / Trunk Assessment: Normal  Communication   Communication: No difficulties  Cognition Arousal/Alertness: Awake/alert Behavior During Therapy: WFL for tasks assessed/performed Overall Cognitive Status: Within Functional Limits for tasks assessed                                          General Comments      Exercises     Assessment/Plan    PT Assessment Patient needs continued PT services  PT Problem List Decreased balance;Decreased activity tolerance;Decreased mobility       PT Treatment Interventions Gait training;Therapeutic exercise;Therapeutic activities    PT Goals (Current goals can be found in the Care Plan section)  Acute Rehab PT Goals Patient Stated Goal: return to working out in the gym PT Goal Formulation: With patient Time For Goal Achievement: 06/16/22 Potential to Achieve Goals: Good    Frequency Min 3X/week     Co-evaluation               AM-PAC PT "6 Clicks" Mobility  Outcome Measure Help needed turning from your back to your side while in a flat bed without using bedrails?: None Help needed moving from lying on your back to sitting on the side of a flat bed without using bedrails?: A Little Help needed moving to and from a bed to a chair (including a wheelchair)?: None Help needed standing up from a chair using your arms (e.g., wheelchair or bedside chair)?: None Help needed to walk in hospital room?: None Help needed climbing 3-5 steps with a railing? : A Little 6 Click Score: 22    End of Session Equipment Utilized During Treatment: Gait  belt Activity Tolerance: Patient tolerated treatment well Patient left: in chair;with call bell/phone within reach;with family/visitor present;with nursing/sitter in room Nurse Communication: Mobility status PT Visit Diagnosis: Difficulty in walking, not elsewhere classified (R26.2)    Time: 5409-8119 PT Time Calculation (min) (ACUTE ONLY): 24 min   Charges:   PT Evaluation $PT Eval Moderate Complexity: 1 Mod PT Treatments $Gait Training: 8-22 mins       Blondell Reveal Kistler PT 06/02/2022  Acute Rehabilitation Services  Office [phone]

## 2022-06-02 NOTE — Discharge Summary (Signed)
Physician Discharge Summary   Patient: Thomas Farrell MRN: [id number] DOB: 04-05-1941  Admit date:     05/30/2022  Discharge date: 06/02/22  Discharge Physician: Marylu Lund   PCP: Lawerance Cruel, MD   Recommendations at discharge:    Follow up with PCP in 1-2 weeks Follow up with Urology in 1-2 weeks  Discharge Diagnoses: Principal Problem:   Acute renal failure (ARF) ([place])  Resolved Problems:   * No resolved hospital problems. [facility] Course: 81yo w/ a hx of HTN, CAD, GERD, HLD, and spinal stenosis who presented w/ a 2-week history of progressively worsening difficulty urinating marked by dysuria, difficulty initiating stream, low stream volume, and a sense of incomplete bladder emptying.  After 1 week of this he then began to notice low back pain.  At times he would strain hard to pass his urine and inadvertently defecate, but detailed history reveals no true episodes of unpredicted unprovoked bowel incontinence.  Over a 3 to 4-day period the patient's family began to notice that he was lethargic and mildly confused.  When his symptoms fail to improve they implored him to seek assistance.  In the ER a CT OF THE L spine noted no acute spinal abnormalities abnormalites and degenerative change not significantly changed since MRI in 2021, but did reveal a markedly enlarged prostate. Labs noted transaminitis and markedly elevated elevated creatinine and BUN. Exam in the ED noted 5/5 strength of B LE and normal reflexes. There was no perineal numbness.    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 Likely due to bladder outlet obstruction due to marked prostate enlargement -suspect this has been progressive which is why the patient is able to tolerate a remarkably elevated BUN - baseline creatinine 1.22 June 2021  -Renal function improving with Foley catheter  -renal ultrasound without evidence of obstruction or hydronephrosis status post Foley catheter placement   -failed voiding trial, thus would benefit from indwelling foley at time of d/c -will need to f/u with Urology as outpatient   Severe uremia -no rub was noted on physical exam  -Improved with hydration   BPH/Markedly enlarged prostate - BOO -Noted on CT lumbar spine -suspect this was leading to bladder outlet obstruction and to blame for the majority of his presenting symptoms -initiate Hytrin and added Urecholine short course  -failed voiding trial, thus recommend d/c with foley cath with close outpatient f/u with Urology   Transaminitis  perhaps due to retained medications in the setting of acute renal failure - viral hepatitis panel negative-patient denies alcohol abuse  -LFT''s trended down. RUQ [place] reviewed, unremarkable   Hyponatremia Likely simply volume related/due to hypovolemia -corrected with volume resuscitation   Newly diagnosed atrial fibrillation Rhythm irregular on auscultation -EKG consistent with atrial fibrillation -rate controlled at present -patient reports being told 1 time a long time ago that he had possible atrial fibrillation -for now we have agreed to monitor this as it could have been brought out by acute distress/electrolyte abnormalities  -Dr. Thereasa Solo discussed anticoagulation with pt, who was not too keen on the idea of initiating anticoagulation, but if his atrial fibrillation persists or even more so if it proves to be paroxysmal this may be appropriate to minimize the risk of stroke  -Pt has since remained stable -Would have pt f/u with Cardiology as outpatient   Hyperglycemia  -no prior history of diabetes - A1c 6.4 therefore not yet meeting criteria for DM   CAD S/P PTCA/DES to  RCA 2013 - followed by Dr. Marlou Porch -no symptoms concerning for angina   HLD Hold medical therapy in setting of transaminitis   HTN Blood pressure reasonably controlled -avoid hypotension in setting of acute kidney failure -monitor without home medications for now         Consultants:  Procedures performed:   Disposition: Home Diet recommendation:  Cardiac diet DISCHARGE MEDICATION: Allergies as of 06/02/2022       Reactions   Acetaminophen Diarrhea, Nausea And Vomiting   Niacin Other (See Comments)   Flushing         Medication List     STOP taking these medications    amLODipine 5 MG tablet Commonly known as: NORVASC   hydrochlorothiazide 12.5 MG capsule Commonly known as: MICROZIDE   lisinopril 40 MG tablet Commonly known as: ZESTRIL       TAKE these medications    bethanechol 10 MG tablet Commonly known as: URECHOLINE Take 1 tablet (10 mg total) by mouth 3 (three) times daily for 3 days.   carvedilol 3.125 MG tablet Commonly known as: COREG TAKE ONE TABLET BY MOUTH TWICE DAILY What changed: when to take this   clopidogrel 75 MG tablet Commonly known as: PLAVIX TAKE ONE TABLET BY MOUTH ONCE DAILY   Co Q-10 100 MG Caps Take 100 mg by mouth daily.   ezetimibe 10 MG tablet Commonly known as: ZETIA TAKE ONE TABLET BY MOUTH DAILY   isosorbide mononitrate 60 MG 24 hr tablet Commonly known as: IMDUR TAKE 1 AND 1/2 TABLETS BY MOUTH ONCE DAILY What changed: how to take this   nitroGLYCERIN 0.4 MG SL tablet Commonly known as: NITROSTAT Place 1 tablet (0.4 mg total) under the tongue every 5 (five) minutes as needed for chest pain (MAX 3 DOSES). What changed:  when to take this reasons to take this   pantoprazole 40 MG tablet Commonly known as: PROTONIX Take 40 mg by mouth every other day.   rosuvastatin 40 MG tablet Commonly known as: CRESTOR TAKE ONE TABLET BY MOUTH DAILY What changed: when to take this   terazosin 2 MG capsule Commonly known as: HYTRIN Take 1 capsule (2 mg total) by mouth at bedtime.        Follow-up Information     Lawerance Cruel, MD Follow up in 2 week(s).   Specialty: Family Medicine Why: Hospital follow up Contact information: [place]  [address] [phone]         Jerline Pain, MD .   Specialty: Cardiology Contact information: [phone] [address] [place] [address] [phone]         Follow up with Urology Follow up in 1 week(s).   Why: Hospital follow up               Discharge Exam: Filed Weights   05/30/22 1111 06/01/22 0500  Weight: 85.7 kg 84.8 kg   General exam: Awake, laying in bed, in nad Respiratory system: Normal respiratory effort, no wheezing Cardiovascular system: regular rate, s1, s2 Gastrointestinal system: Soft, nondistended, positive BS Central nervous system: CN2-12 grossly intact, strength intact Extremities: Perfused, no clubbing Skin: Normal skin turgor, no notable skin lesions seen Psychiatry: Mood normal // no visual hallucinations   Condition at discharge: fair  The results of significant diagnostics from this hospitalization (including imaging, microbiology, ancillary and laboratory) are listed below for reference.   Imaging Studies: US Abdomen Limited RUQ (LIVER/GB)  Result Date: 06/02/2022 CLINICAL DATA:  Elevated liver function tests EXAM: ULTRASOUND ABDOMEN LIMITED RIGHT UPPER QUADRANT COMPARISON:  None Available. FINDINGS: Gallbladder: Sludge is seen within the gallbladder. The gallbladder, however, is not distended, there is no gallbladder wall thickening, and no pericholecystic fluid is identified. The sonographic Percell Miller sign is reportedly negative. Common bile duct: Diameter: 5 mm in proximal diameter Liver: No focal lesion identified. Within normal limits in parenchymal echogenicity. Portal vein is patent on color Doppler imaging with normal direction of blood flow towards the liver. Other: None. IMPRESSION: Gallbladder sludge without sonographic evidence of acute cholecystitis. Electronically Signed   By: Fidela Salisbury M.D.   On: 06/02/2022 00:04   US RENAL  Result Date: 05/30/2022 CLINICAL DATA:  Acute renal failure EXAM: RENAL / URINARY  TRACT ULTRASOUND COMPLETE COMPARISON:  None Available. FINDINGS: Right Kidney: Renal measurements: 12.4 x 6.3 x 7.4 cm = volume: 299 mL. Echogenicity within normal limits. No mass or hydronephrosis visualized. Left Kidney: Renal measurements: 11.8 x 6.3 x 6.8 cm = volume: 264 mL. Echogenicity within normal limits. No mass or hydronephrosis visualized. Bladder: Appears normal for degree of bladder distention. Foley catheter present. Other: None. IMPRESSION: 1. No acute abnormality. Electronically Signed   By: Ronney Asters M.D.   On: 05/30/2022 21:13   CT Lumbar Spine Wo Contrast  Result Date: 05/30/2022 CLINICAL DATA:  Low back pain, increased fracture risk EXAM: CT LUMBAR SPINE WITHOUT CONTRAST TECHNIQUE: Multidetector CT imaging of the lumbar spine was performed without intravenous contrast administration. Multiplanar CT image reconstructions were also generated. RADIATION DOSE REDUCTION: This exam was performed according to the departmental dose-optimization program which includes automated exposure control, adjustment of the mA and/or kV according to patient size and/or use of iterative reconstruction technique. COMPARISON:  MRI November 2021 FINDINGS: Segmentation: 5 lumbar type vertebrae. Alignment: Stable with mild degenerative listhesis. Vertebrae: Stable vertebral body heights with degenerative endplate irregularity. No acute fracture. Paraspinal and other soft tissues: Bladder wall thickening. Partially imaged fat infiltration in the pelvis. Markedly enlarged prostate. Atherosclerosis. Disc levels: Multilevel disc space narrowing with disc bulges, endplate osteophytes, and vacuum phenomenon. Multilevel facet hypertrophy with ligamentum flavum thickening. Appearance is similar to 2021 MRI. IMPRESSION: No acute osseous abnormality. Degenerative changes appear similar to 2021 MRI. Bladder wall thickening probably secondary to chronic outlet obstruction from markedly enlarged prostate. Indeterminate  partially imaged fat infiltration in the pelvis that may reflect acute inflammation. Electronically Signed   By: Macy Mis M.D.   On: 05/30/2022 12:02    Microbiology: No results found for this or any previous visit.  Labs: CBC: Recent Labs  Lab 05/30/22 1141 05/31/22 0546 06/01/22 0908  WBC 9.8 11.2* 12.0*  NEUTROABS 7.7  --   --   HGB 13.1 12.7* 12.3*  HCT 37.9* 37.5* 35.3*  MCV 85.0 87.0 85.9  PLT 184 223 924   Basic Metabolic Panel: Recent Labs  Lab 05/30/22 1141 05/31/22 0546 06/01/22 0908 06/02/22 0542  NA 131* 137 141 142  K 3.6 3.7 3.2* 3.2*  CL 94* 102 107 110  CO2 20* '23 24 24  '$ GLUCOSE 173* 120* 145* 112*  BUN 119* 109* 88* 60*  CREATININE 4.54* 3.54* 2.57* 1.76*  CALCIUM 9.0 8.2* 8.2* 7.9*  MG  --  2.3  --   --   PHOS  --  5.2*  --   --    Liver Function Tests: Recent Labs  Lab 05/30/22 1141 05/31/22 0546 06/01/22 0908 06/02/22 0542  AST 499* 278* 301* 140*  ALT 272* 215*  248* 170*  ALKPHOS 110 95 130* 113  BILITOT 0.7 0.8 0.9 0.8  PROT 6.7 5.8* 5.7* 5.4*  ALBUMIN 3.4* 2.4* 2.4* 2.2*   CBG: No results for input(s): "GLUCAP" in the last 168 hours.  Discharge time spent: less than 30 minutes.  Signed: Marylu Lund, MD [facility] 06/02/2022

## 2022-06-03 ENCOUNTER — Other Ambulatory Visit (HOSPITAL_COMMUNITY): Payer: Self-pay

## 2022-06-04 NOTE — Progress Notes (Unsigned)
Office Visit    Patient Name: Thomas Farrell Date of Encounter: 06/04/2022  Primary Care Provider:  Lawerance Cruel, MD Primary Cardiologist:  Candee Furbish, MD Primary Electrophysiologist: None  Chief Complaint    ABRON NEDDO is a 80 y.o. male with PMH of HTN, CAD s/p PTCA/DES to distal RCA with wire-induced dissection in distal RCA 2009 and DES and cutting balloon angioplasty to mRCA 2013, GERD, HLD, spinal stenosis, mild cardiomyopathy with EF of 50-55% in 2017 who presents today for new onset atrial fibrillation and post hospital follow-up.  Past Medical History    Past Medical History:  Diagnosis Date   CAD in native artery    a. 2009 - s/p PTCA/DES to RCA with wire-induced dissection in distal RCA. b. 2013 - s/p DES to dRCA and cutting balloon angioplasty to ISR to mRCA   Cancer of skin of temple    "right"   Cardiomyopathy ([place])    a. EF 45-50% in 2009. b. 50-55% by echo 04/2016.   CKD (chronic kidney disease), stage III (HCC)    GERD (gastroesophageal reflux disease)    Hyperlipidemia    Hypertension    Myocardial infarction [facility]) 2009   Obesity    Osteoarthritis    Past Surgical History:  Procedure Laterality Date   CARDIAC CATHETERIZATION  2009;  12/2011   CORONARY ANGIOPLASTY WITH STENT PLACEMENT  2009; 01/2012   "~ 2wk after each cath"   INGUINAL HERNIA REPAIR Right ~ 2010   [place] (PCI-S) N/A 01/25/2012   Procedure: PERCUTANEOUS CORONARY STENT INTERVENTION (PCI-S);  Surgeon: Jettie Booze, MD;  Location: [facility] CATH LAB;  Service: Cardiovascular;  Laterality: N/A;   SKIN CANCER EXCISION Right    temple   TONSILLECTOMY      Allergies  Allergies  Allergen Reactions   Acetaminophen Diarrhea and Nausea And Vomiting   Niacin Other (See Comments)    Flushing     History of Present Illness    Thomas Farrell is a 81 year old with the above-mentioned past medical history who presents today for posthospital  follow-up and new onset atrial fibrillation.  He was last seen by Dr. Marlou Porch on 08/2021 for annual follow-up.  He was doing well overall with exception of back and hip pain.  He denied any anginal pain or shortness of breath.  No medication adjustments were made at that time.  [name] was seen in the ED at [facility] on 05/30/2022 for complaint of low back pain.  Patient further stated that for 2 weeks had difficulty urinating and initiating stream.  Patient developed lethargy and mild confusion over 3 to 4-day period and presented to the ED for treatment.  [name] underwent CT of the lower spine that revealed no spinal abnormalities but did reveal enlarged prostate.  Labs completed and revealed elevated creatinine, BUN and transaminases levels.  He was diagnosed with severe acute renal failure with CKD stage II and dehydration.    He was transferred to [facility] and was admitted to telemetry for further management.  Patient was found to have rate controlled atrial fibrillation on telemetry monitor.  Patient was monitored and anticoagulation was deferred by patient's choice.  He was advised that if AF remained persistent that anticoagulation would be needed to minimize stroke risk. Patient is renal function improved with Foley catheter placement and he was discharged 3 days later with instructions to follow-up with cardiology and urology.    Since last  being seen in the office patient reports***.  Patient denies chest pain, palpitations, dyspnea, PND, orthopnea, nausea, vomiting, dizziness, syncope, edema, weight gain, or early satiety.     ***Notes: -Last ischemic evaluation completed 6/2017showed prior infarct but no ischemia -Last 2D echo completed 2017 with EF 50-55% mild focal basal hypertrophy of the septum, EF 50-55% with mild HK of the inferolateral and inferior myocardium, mod LAE.   -Needs EKG GDMT consist of Plavix,Zetia,coreg,Crestor,Imdur,  Rate control may need  to switch from coreg to toprol  Home Medications    Current Outpatient Medications  Medication Sig Dispense Refill   bethanechol (URECHOLINE) 10 MG tablet Take 1 tablet by mouth 3 times daily for 3 days. 9 tablet 0   carvedilol (COREG) 3.125 MG tablet TAKE ONE TABLET BY MOUTH TWICE DAILY (Patient taking differently: Take 3.125 mg by mouth 2 (two) times daily with a meal.) 180 tablet 2   clopidogrel (PLAVIX) 75 MG tablet TAKE ONE TABLET BY MOUTH ONCE DAILY (Patient taking differently: Take 75 mg by mouth daily.) 90 tablet 3   Coenzyme Q10 (CO Q-10) 100 MG CAPS Take 100 mg by mouth daily.     ezetimibe (ZETIA) 10 MG tablet TAKE ONE TABLET BY MOUTH DAILY (Patient taking differently: Take 10 mg by mouth daily.) 90 tablet 2   isosorbide mononitrate (IMDUR) 60 MG 24 hr tablet TAKE 1 AND 1/2 TABLETS BY MOUTH ONCE DAILY (Patient taking differently: 90 mg daily.) 135 tablet 2   nitroGLYCERIN (NITROSTAT) 0.4 MG SL tablet Place 1 tablet (0.4 mg total) under the tongue every 5 (five) minutes as needed for chest pain (MAX 3 DOSES). (Patient taking differently: Place 0.4 mg under the tongue every 5 (five) minutes x 3 doses as needed for chest pain.) 25 tablet 3   pantoprazole (PROTONIX) 40 MG tablet Take 40 mg by mouth every other day.     rosuvastatin (CRESTOR) 40 MG tablet TAKE ONE TABLET BY MOUTH DAILY (Patient taking differently: Take 40 mg by mouth at bedtime.) 90 tablet 2   terazosin (HYTRIN) 2 MG capsule Take 1 capsule (2 mg total) by mouth at bedtime. 30 capsule 0   No current facility-administered medications for this visit.     Review of Systems  Please see the history of present illness.    (+)*** (+)***  All other systems reviewed and are otherwise negative except as noted above.  Physical Exam    Wt Readings from Last 3 Encounters:  06/01/22 186 lb 15.2 oz (84.8 kg)  09/02/21 206 lb (93.4 kg)  09/21/20 214 lb 3.2 oz (97.2 kg)   YW:VPXTG were no vitals filed for this visit.,There is  no height or weight on file to calculate BMI.  Constitutional:      Appearance: Healthy appearance. Not in distress.  Neck:     Vascular: JVD normal.  Pulmonary:     Effort: Pulmonary effort is normal.     Breath sounds: No wheezing. No rales. Diminished in the bases Cardiovascular:     Normal rate. Regular rhythm. Normal S1. Normal S2.      Murmurs: There is no murmur.  Edema:    Peripheral edema absent.  Abdominal:     Palpations: Abdomen is soft non tender. There is no hepatomegaly.  Skin:    General: Skin is warm and dry.  Neurological:     General: No focal deficit present.     Mental Status: Alert and oriented to person, place and time.     Cranial  Nerves: Cranial nerves are intact.  EKG/LABS/Other Studies Reviewed    ECG personally reviewed by me today - ***  Risk Assessment/Calculations:   {Does this patient have ATRIAL FIBRILLATION?[phone]}        Lab Results  Component Value Date   WBC 12.0 (H) 06/01/2022   HGB 12.3 (L) 06/01/2022   HCT 35.3 (L) 06/01/2022   MCV 85.9 06/01/2022   PLT 251 06/01/2022   Lab Results  Component Value Date   CREATININE 1.76 (H) 06/02/2022   BUN 60 (H) 06/02/2022   NA 142 06/02/2022   K 3.2 (L) 06/02/2022   CL 110 06/02/2022   CO2 24 06/02/2022   Lab Results  Component Value Date   ALT 170 (H) 06/02/2022   AST 140 (H) 06/02/2022   ALKPHOS 113 06/02/2022   BILITOT 0.8 06/02/2022   Lab Results  Component Value Date   CHOL 131 11/22/2013   HDL 50.40 11/22/2013   LDLCALC 62 11/22/2013   TRIG 94.0 11/22/2013   CHOLHDL 3 11/22/2013    Lab Results  Component Value Date   HGBA1C 6.4 (H) 05/31/2022    Assessment & Plan    1.  Coronary artery disease  2.  New onset atrial fibrillation  3.  Essential hypertension  4.  Hyperlipidemia  5.  CKD stage II      Disposition: Follow-up with Candee Furbish, MD or APP in *** months {Are you ordering a CV Procedure (e.g. stress test, cath, DCCV, TEE, etc)?   Press F2         :[id number]}   Medication Adjustments/Labs and Tests Ordered: Current medicines are reviewed at length with the patient today.  Concerns regarding medicines are outlined above.   Signed, Mable Fill, Marissa Nestle, NP 06/04/2022, 1:10 PM [place]

## 2022-06-07 ENCOUNTER — Ambulatory Visit (INDEPENDENT_AMBULATORY_CARE_PROVIDER_SITE_OTHER): Payer: Medicare HMO

## 2022-06-07 ENCOUNTER — Ambulatory Visit: Payer: Medicare HMO | Admitting: Nurse Practitioner

## 2022-06-07 ENCOUNTER — Encounter: Payer: Self-pay | Admitting: Nurse Practitioner

## 2022-06-07 ENCOUNTER — Other Ambulatory Visit: Payer: Self-pay | Admitting: Nurse Practitioner

## 2022-06-07 VITALS — BP 102/60 | HR 67 | Ht 68.0 in | Wt 185.0 lb

## 2022-06-07 DIAGNOSIS — I4891 Unspecified atrial fibrillation: Secondary | ICD-10-CM

## 2022-06-07 DIAGNOSIS — E7800 Pure hypercholesterolemia, unspecified: Secondary | ICD-10-CM

## 2022-06-07 DIAGNOSIS — N171 Acute kidney failure with acute cortical necrosis: Secondary | ICD-10-CM

## 2022-06-07 DIAGNOSIS — I2510 Atherosclerotic heart disease of native coronary artery without angina pectoris: Secondary | ICD-10-CM | POA: Diagnosis not present

## 2022-06-07 DIAGNOSIS — I10 Essential (primary) hypertension: Secondary | ICD-10-CM

## 2022-06-07 NOTE — Patient Instructions (Signed)
Medication Instructions:  Your physician recommends that you continue on your current medications as directed. Please refer to the Current Medication list given to you today.  *If you need a refill on your cardiac medications before your next appointment, please call your pharmacy*   Lab Work: Bmet today If you have labs (blood work) drawn today and your tests are completely normal, you will receive your results only by: [place] (if you have MyChart) OR A paper copy in the mail If you have any lab test that is abnormal or we need to change your treatment, we will call you to review the results.   Testing/Procedures: ZIO AT Long term monitor-Live Telemetry  Your physician has requested you wear a ZIO patch monitor for 14 days.  This is a single patch monitor. [employer] supplies one patch monitor per enrollment. Additional  stickers are not available.  Please do not apply patch if you will be having a Nuclear Stress Test, Echocardiogram, Cardiac CT, MRI,  or Chest Xray during the period you would be wearing the monitor. The patch cannot be worn during  these tests. You cannot remove and re-apply the ZIO AT patch monitor.  Your ZIO patch monitor will be mailed 3 day USPS to your address on file. It may take 3-5 days to  receive your monitor after you have been enrolled.  Once you have received your monitor, please review the enclosed instructions. Your monitor has  already been registered assigning a specific monitor serial # to you.   Billing and Patient Assistance Program information  Theodore Demark has been supplied with any insurance information on record for billing. [employer] offers a sliding scale Patient Assistance Program for patients without insurance, or whose  insurance does not completely cover the cost of the ZIO patch monitor. You must apply for the  Patient Assistance Program to qualify for the discounted rate. To apply, call [employer] at [phone],  select option 4,  select option 2 , ask to apply for the Patient Assistance Program, (you can request an  interpreter if needed). [employer] will ask your household income and how many people are in your  household. [employer] will quote your out-of-pocket cost based on this information. They will also be able  to set up a 12 month interest free payment plan if needed.  Applying the monitor   Shave hair from upper left chest.  Hold the abrader disc by orange tab. Rub the abrader in 40 strokes over left upper chest as indicated in  your monitor instructions.  Clean area with 4 enclosed alcohol pads. Use all pads to ensure the area is cleaned thoroughly. Let  dry.  Apply patch as indicated in monitor instructions. Patch will be placed under collarbone on left side of  chest with arrow pointing upward.  Rub patch adhesive wings for 2 minutes. Remove the white label marked "1". Remove the white label  marked "2". Rub patch adhesive wings for 2 additional minutes.  While looking in a mirror, press and release button in center of patch. A small green light will flash 3-4  times. This will be your only indicator that the monitor has been turned on.  Do not shower for the first 24 hours. You may shower after the first 24 hours.  Press the button if you feel a symptom. You will hear a small click. Record Date, Time and Symptom in  the Patient Log.   Starting the Gateway  In your kit there is a Hydrographic surveyor  box the size of a cellphone. This is Airline pilot. It transmits all your  recorded data to [facility]. This box must always stay within 10 feet of you. Open the box and push the *  button. There will be a light that blinks orange and then green a few times. When the light stops  blinking, the Gateway is connected to the ZIO patch. Call [employer] at [phone] to confirm your monitor is transmitting.  Returning your monitor  Remove your patch and place it inside the [place]. In the lower half of the Gateway there is a  white  bag with prepaid postage on it. Place Gateway in bag and seal. Mail package back to [place] as soon as  possible. Your physician should have your final report approximately 7 days after you have mailed back  your monitor. Call [place] at [phone] if you have questions regarding your ZIO AT  patch monitor. Call them immediately if you see an orange light blinking on your monitor.  If your monitor falls off in less than 4 days, contact our Monitor department at [phone]. If your  monitor becomes loose or falls off after 4 days call [employer] at [phone] for suggestions on  securing your monitor   Follow-Up: At [facility], you and your health needs are our priority.  As part of our continuing mission to provide you with exceptional heart care, we have created designated Provider Care Teams.  These Care Teams include your primary Cardiologist (physician) and Advanced Practice Providers (APPs -  Physician Assistants and Nurse Practitioners) who all work together to provide you with the care you need, when you need it.  We recommend signing up for the patient portal called "MyChart".  Sign up information is provided on this After Visit Summary.  MyChart is used to connect with patients for Virtual Visits (Telemedicine).  Patients are able to view lab/test results, encounter notes, upcoming appointments, etc.  Non-urgent messages can be sent to your provider as well.   To learn more about what you can do with MyChart, go to [email or web address].    Your next appointment:   As scheduled   The format for your next appointment:     Provider:   Candee Furbish, MD

## 2022-06-07 NOTE — Progress Notes (Unsigned)
Enrolled for [employer] to mail a ZIO XT long term holter monitor to the patients address on file.   ZIO XT order confirmed.  Dr. Marlou Porch to read.

## 2022-06-08 LAB — BASIC METABOLIC PANEL
BUN/Creatinine Ratio: 17 (ref 10–24)
BUN: 21 mg/dL (ref 8–27)
CO2: 21 mmol/L (ref 20–29)
Calcium: 8.1 mg/dL — ABNORMAL LOW (ref 8.6–10.2)
Chloride: 107 mmol/L — ABNORMAL HIGH (ref 96–106)
Creatinine, Ser: 1.21 mg/dL (ref 0.76–1.27)
Glucose: 107 mg/dL — ABNORMAL HIGH (ref 70–99)
Potassium: 4.3 mmol/L (ref 3.5–5.2)
Sodium: 142 mmol/L (ref 134–144)
eGFR: 60 mL/min/{1.73_m2} (ref >59)

## 2022-06-10 DIAGNOSIS — I4891 Unspecified atrial fibrillation: Secondary | ICD-10-CM | POA: Diagnosis not present

## 2022-06-10 DIAGNOSIS — I2510 Atherosclerotic heart disease of native coronary artery without angina pectoris: Secondary | ICD-10-CM

## 2022-06-13 DIAGNOSIS — Z09 Encounter for follow-up examination after completed treatment for conditions other than malignant neoplasm: Secondary | ICD-10-CM | POA: Diagnosis not present

## 2022-06-13 DIAGNOSIS — R339 Retention of urine, unspecified: Secondary | ICD-10-CM | POA: Diagnosis not present

## 2022-06-13 DIAGNOSIS — I480 Paroxysmal atrial fibrillation: Secondary | ICD-10-CM | POA: Diagnosis not present

## 2022-06-13 DIAGNOSIS — E871 Hypo-osmolality and hyponatremia: Secondary | ICD-10-CM | POA: Diagnosis not present

## 2022-06-13 DIAGNOSIS — N401 Enlarged prostate with lower urinary tract symptoms: Secondary | ICD-10-CM | POA: Diagnosis not present

## 2022-06-13 DIAGNOSIS — R338 Other retention of urine: Secondary | ICD-10-CM | POA: Diagnosis not present

## 2022-06-13 DIAGNOSIS — N179 Acute kidney failure, unspecified: Secondary | ICD-10-CM | POA: Diagnosis not present

## 2022-06-16 DIAGNOSIS — N1330 Unspecified hydronephrosis: Secondary | ICD-10-CM | POA: Diagnosis not present

## 2022-06-16 DIAGNOSIS — N134 Hydroureter: Secondary | ICD-10-CM | POA: Diagnosis not present

## 2022-06-16 DIAGNOSIS — N281 Cyst of kidney, acquired: Secondary | ICD-10-CM | POA: Diagnosis not present

## 2022-06-16 DIAGNOSIS — N400 Enlarged prostate without lower urinary tract symptoms: Secondary | ICD-10-CM | POA: Diagnosis not present

## 2022-06-25 ENCOUNTER — Encounter (HOSPITAL_COMMUNITY): Payer: Self-pay | Admitting: Emergency Medicine

## 2022-06-25 ENCOUNTER — Emergency Department (HOSPITAL_COMMUNITY): Payer: Medicare HMO

## 2022-06-25 ENCOUNTER — Emergency Department (HOSPITAL_COMMUNITY)
Admission: EM | Admit: 2022-06-25 | Discharge: 2022-06-25 | Disposition: A | Discharge status: Home / Self Care | Payer: Medicare HMO | Attending: Emergency Medicine | Admitting: Emergency Medicine

## 2022-06-25 DIAGNOSIS — R339 Retention of urine, unspecified: Secondary | ICD-10-CM | POA: Insufficient documentation

## 2022-06-25 DIAGNOSIS — N368 Other specified disorders of urethra: Secondary | ICD-10-CM | POA: Insufficient documentation

## 2022-06-25 DIAGNOSIS — R319 Hematuria, unspecified: Secondary | ICD-10-CM | POA: Diagnosis not present

## 2022-06-25 DIAGNOSIS — T83021A Displacement of indwelling urethral catheter, initial encounter: Secondary | ICD-10-CM | POA: Diagnosis not present

## 2022-06-25 DIAGNOSIS — R310 Gross hematuria: Secondary | ICD-10-CM | POA: Diagnosis not present

## 2022-06-25 DIAGNOSIS — R300 Dysuria: Secondary | ICD-10-CM | POA: Diagnosis not present

## 2022-06-25 DIAGNOSIS — I2510 Atherosclerotic heart disease of native coronary artery without angina pectoris: Secondary | ICD-10-CM | POA: Insufficient documentation

## 2022-06-25 DIAGNOSIS — Z7902 Long term (current) use of antithrombotics/antiplatelets: Secondary | ICD-10-CM | POA: Diagnosis not present

## 2022-06-25 DIAGNOSIS — N3289 Other specified disorders of bladder: Secondary | ICD-10-CM | POA: Diagnosis not present

## 2022-06-25 DIAGNOSIS — T83098A Other mechanical complication of other indwelling urethral catheter, initial encounter: Secondary | ICD-10-CM | POA: Diagnosis not present

## 2022-06-25 DIAGNOSIS — N189 Chronic kidney disease, unspecified: Secondary | ICD-10-CM | POA: Diagnosis not present

## 2022-06-25 DIAGNOSIS — I129 Hypertensive chronic kidney disease with stage 1 through stage 4 chronic kidney disease, or unspecified chronic kidney disease: Secondary | ICD-10-CM | POA: Diagnosis not present

## 2022-06-25 NOTE — Discharge Instructions (Signed)
You were seen in the emergency department today after accidentally stepping on your Foley catheter bag.  Your ultrasound showed that the Foley catheter is in the proper place and we suspect you had some mild irritation of the urethra that caused some mild bleeding.  Hold your Plavix for the next 3 days.  Call your urologist to schedule your follow-up appointment next week.  Come back if you have any severe worsening pain, high fevers, no drainage within your Foley catheter bag, or any other symptoms concerning to you.

## 2022-06-25 NOTE — ED Notes (Signed)
Ultrasound at bedside

## 2022-06-25 NOTE — ED Notes (Signed)
Patient given discharge instructions, all questions answered. Patient in possession of all belongings, directed to the discharge area  

## 2022-06-25 NOTE — ED Triage Notes (Signed)
Pt stepped on foley catheter around 1 today and has had blood in bag since. Also having pain when having to urinate. Pt has had foley since 7/13 due to enlarged prostate.

## 2022-06-25 NOTE — ED Provider Notes (Signed)
[place] DEPT Provider Note   CSN: [id number] Arrival date & time: 06/25/22  1759     History  Chief Complaint  Patient presents with   Hematuria    Thomas Farrell is a 81 y.o. male. With pmh BPH with indwelling foley catheter, HTN, CAD on Plavix, CKD recent admit 7/10-7/13/23 for urinary retention and AKI requiring foley placement now presenting with hematuria and penile pain after accidentally stepping on foley bag.  Around 930 this morning, patient was walking when he accidentally stepped onto his Foley catheter bag causing it to pull.  Since then, he has been having persistent dysuria and hematuria.  He is on Plavix but no other anticoagulation or aspirin.  He had no abdominal pain, fevers, dysuria or hematuria prior to this accident.  He has had no chest pain, lightheadedness or syncopal episodes. He follows with Dr Tresa Moore of Urology.   Hematuria       Home Medications Prior to Admission medications   Medication Sig Start Date End Date Taking? Authorizing Provider  carvedilol (COREG) 3.125 MG tablet TAKE ONE TABLET BY MOUTH TWICE DAILY 02/18/22   Jerline Pain, MD  clopidogrel (PLAVIX) 75 MG tablet TAKE ONE TABLET BY MOUTH ONCE DAILY 11/19/21   Jerline Pain, MD  Coenzyme Q10 (CO Q-10) 100 MG CAPS Take 100 mg by mouth daily.    [provider]  ezetimibe (ZETIA) 10 MG tablet TAKE ONE TABLET BY MOUTH DAILY 02/18/22   Jerline Pain, MD  isosorbide mononitrate (IMDUR) 60 MG 24 hr tablet TAKE 1 AND 1/2 TABLETS BY MOUTH ONCE DAILY 02/18/22   Jerline Pain, MD  nitroGLYCERIN (NITROSTAT) 0.4 MG SL tablet Place 1 tablet (0.4 mg total) under the tongue every 5 (five) minutes as needed for chest pain (MAX 3 DOSES). 07/06/20   Burtis Junes, NP  pantoprazole (PROTONIX) 40 MG tablet Take 40 mg by mouth every other day.    [provider]  rosuvastatin (CRESTOR) 40 MG tablet TAKE ONE TABLET BY MOUTH DAILY 02/18/22   Jerline Pain, MD   terazosin (HYTRIN) 2 MG capsule Take 1 capsule (2 mg total) by mouth at bedtime. 06/02/22 07/02/22  Donne Hazel, MD      Allergies    Acetaminophen and Niacin    Review of Systems   Review of Systems  Genitourinary:  Positive for hematuria.    Physical Exam Updated Vital Signs BP (!) 191/92   Pulse 86   Temp 98.1 F (36.7 C) (Oral)   Resp 16   Ht '5\' 8"'$  (1.727 m)   Wt 81.6 kg   SpO2 97%   BMI 27.37 kg/m  Physical Exam Constitutional: Alert and oriented. Well appearing and in no distress. Eyes: Conjunctivae are normal. ENT      Head: Normocephalic and atraumatic.      Nose: No congestion.      Mouth/Throat: Mucous membranes are moist.      Neck: No stridor. Cardiovascular: S1, S2,  warm and well perfused, no tachycardia Respiratory: Normal respiratory effort.  Gastrointestinal: Soft and nontender. There is no CVA tenderness. GU: Small-volume dried blood and mild oozing around external urethral meatus with Foley catheter in place, clear red urine with foley bag, no visualized clots within foley bag. Musculoskeletal: Normal range of motion in all extremities. Neurologic: Normal speech and language. No gross focal neurologic deficits are appreciated. Skin: Skin is warm, dry and intact. No rash noted. Psychiatric: Mood and affect are  normal. Speech and behavior are normal.   ED Results / Procedures / Treatments   Labs (all labs ordered are listed, but only abnormal results are displayed) Labs Reviewed - No data to display  EKG None  Radiology US Renal  Result Date: 06/25/2022 CLINICAL DATA:  Displacement of Foley catheter EXAM: RENAL / URINARY TRACT ULTRASOUND COMPLETE COMPARISON:  05/30/2022 FINDINGS: Right Kidney: Renal measurements: 10.8 x 5.6 x 4.7 = volume: 151 mL. Echogenicity within normal limits. No mass or hydronephrosis visualized. Left Kidney: Renal measurements: 10.5 x 6.3 x 3.9 cm = volume: 137 mL. Echogenicity within normal limits. No mass or  hydronephrosis visualized. Bladder: Trabeculation of the bladder wall.  Prostate appears enlarged. Other: None. IMPRESSION: No acute findings.  No hydronephrosis. 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 Electronically Signed   By: Rolm Baptise M.D.   On: 06/25/2022 19:14    Procedures Procedures    Medications Ordered in ED Medications - No data to display  ED Course/ Medical Decision Making/ A&P Clinical Course as of 06/25/22 1935  Sat Jun 25, 2022  1923 Renal ultrasound with no evidence of dislodgment, no hydronephrosis or other concerning findings suggestive of clot on renal ultrasound.  He continues to drain Foley catheter with urine.  No concern for blockage or clot at this time.  Spoke with Dr. Jeffie Pollock of urology who recommended holding his Plavix for the next 72 hours and calling Dr. Tammi Klippel of urology to schedule his follow-up appointment next week. Strict return precautions discussed, he is safe for discharge home. [VB]    Clinical Course User Index [VB] Elgie Congo, MD                           Medical Decision Making Thomas Farrell is a 81 y.o. male. With pmh BPH with indwelling foley catheter, HTN, CAD on Plavix, CKD recent admit 7/10-7/13/23 for urinary retention and AKI requiring foley placement now presenting with hematuria and penile pain after accidentally stepping on foley bag.  Patient with hematuria, dysuria and pain after accidentally stepping on Foley bag.  He has some mild bleeding and dried blood around his urethral meatus but still red clear urine draining within Foley bag, no evidence of clots.  Suspect likely traumatic urethral injury from stepping on Foley bag, will obtain renal ultrasound to ensure no dislodgment of Foley catheter and consult urology for any further recommendations.  No concern for infection since pain and dysuria only began after accidentally stepping on catheter.  He appears to be completely  draining, will obtain PVR to ensure no blockage.  He has no symptoms suggestive of symptomatic anemia and is not tachycardic or hypotensive concerning for large-volume bleeding.  Will obtain renal ultrasound, PVR and UA and consult urology. Suspect discharge with outpatient follow up.  Amount and/or Complexity of Data Reviewed Independent Historian: spouse Radiology: ordered and independent interpretation performed.    Details: No hydronephrosis on Ultrasound     Final Clinical Impression(s) / ED Diagnoses Final diagnoses:  Urethral bleeding  Hematuria, unspecified type    Rx / DC Orders ED Discharge Orders     None         Elgie Congo, MD 06/25/22 1935

## 2022-06-27 DIAGNOSIS — T83021A Displacement of indwelling urethral catheter, initial encounter: Secondary | ICD-10-CM | POA: Diagnosis not present

## 2022-07-01 DIAGNOSIS — I4891 Unspecified atrial fibrillation: Secondary | ICD-10-CM | POA: Diagnosis not present

## 2022-07-01 DIAGNOSIS — I2510 Atherosclerotic heart disease of native coronary artery without angina pectoris: Secondary | ICD-10-CM | POA: Diagnosis not present

## 2022-07-04 ENCOUNTER — Telehealth: Payer: Self-pay

## 2022-07-04 NOTE — Telephone Encounter (Signed)
Please let [name] know that his event monitor showed no evidence of atrial fibrillation average heart rate of 71 bpm and rare PACs and occasional PVCs with 1.9% burden.  These results are reassuring that atrial fibrillation was an isolated event that was related to his procedure.  Please advise him that no further treatment or testing is needed at this time.  Continue current medications as prescribed.   Ambrose Pancoast, NP   Pt called and made aware of monitor results as stated above by E. Barbarann Ehlers, NP.  Pt did not have any questions, but told to reach out to [facility] on [address] if he becomes symptomatic, or has new / worsening symptoms. Pt understood, and no f/u req at this time.

## 2022-07-11 DIAGNOSIS — R338 Other retention of urine: Secondary | ICD-10-CM | POA: Diagnosis not present

## 2022-07-18 ENCOUNTER — Encounter (HOSPITAL_COMMUNITY): Payer: Self-pay

## 2022-07-18 ENCOUNTER — Emergency Department (HOSPITAL_COMMUNITY)
Admission: EM | Admit: 2022-07-18 | Discharge: 2022-07-18 | Disposition: A | Discharge status: Home / Self Care | Payer: Medicare HMO | Attending: Emergency Medicine | Admitting: Emergency Medicine

## 2022-07-18 ENCOUNTER — Other Ambulatory Visit: Payer: Self-pay

## 2022-07-18 DIAGNOSIS — R338 Other retention of urine: Secondary | ICD-10-CM

## 2022-07-18 DIAGNOSIS — R339 Retention of urine, unspecified: Secondary | ICD-10-CM | POA: Insufficient documentation

## 2022-07-18 DIAGNOSIS — Z7902 Long term (current) use of antithrombotics/antiplatelets: Secondary | ICD-10-CM | POA: Diagnosis not present

## 2022-07-18 LAB — URINALYSIS, ROUTINE W REFLEX MICROSCOPIC
Bilirubin Urine: NEGATIVE
Glucose, UA: NEGATIVE mg/dL
Ketones, ur: NEGATIVE mg/dL
Nitrite: NEGATIVE
Protein, ur: NEGATIVE mg/dL
Specific Gravity, Urine: 1.005 (ref 1.005–1.030)
WBC, UA: >50 WBC/hpf — ABNORMAL HIGH (ref 0–5)
pH: 6 (ref 5.0–8.0)

## 2022-07-18 NOTE — ED Notes (Signed)
Patient was able to urinate and states that he feels better after peeing. Bladder scan shows 0 mL. PA Merrily Pew is aware.

## 2022-07-18 NOTE — ED Provider Notes (Signed)
[facility] DEPT Provider Note   CSN: [id number] Arrival date & time: 07/18/22  2110     History  Chief Complaint  Patient presents with   Urinary Retention    Thomas Farrell is a 81 y.o. male.  Patient with history of enlarged prostate, admitted to the hospital in July for postobstructive renal failure, with indwelling Foley since that time --presents to the emergency department today for acute urinary retention.  Patient states that around 6 PM he emptied his bag.  Since that time he has only had a minimal amount of output through the Foley.  He has had increasing lower abdominal pain and pressure.  No for fevers, flank pain or vomiting.  The obstruction did not clear at home, prompting emergency department visit.  Catheter was last changed about 2 weeks ago.  Plan follow-up with urology in September.  He will have the Foley until at least that time.       Home Medications Prior to Admission medications   Medication Sig Start Date End Date Taking? Authorizing Provider  carvedilol (COREG) 3.125 MG tablet TAKE ONE TABLET BY MOUTH TWICE DAILY 02/18/22   Jerline Pain, MD  clopidogrel (PLAVIX) 75 MG tablet TAKE ONE TABLET BY MOUTH ONCE DAILY 11/19/21   Jerline Pain, MD  Coenzyme Q10 (CO Q-10) 100 MG CAPS Take 100 mg by mouth daily.    [provider]  ezetimibe (ZETIA) 10 MG tablet TAKE ONE TABLET BY MOUTH DAILY 02/18/22   Jerline Pain, MD  isosorbide mononitrate (IMDUR) 60 MG 24 hr tablet TAKE 1 AND 1/2 TABLETS BY MOUTH ONCE DAILY 02/18/22   Jerline Pain, MD  nitroGLYCERIN (NITROSTAT) 0.4 MG SL tablet Place 1 tablet (0.4 mg total) under the tongue every 5 (five) minutes as needed for chest pain (MAX 3 DOSES). 07/06/20   Burtis Junes, NP  pantoprazole (PROTONIX) 40 MG tablet Take 40 mg by mouth every other day.    [provider]  rosuvastatin (CRESTOR) 40 MG tablet TAKE ONE TABLET BY MOUTH DAILY 02/18/22   Jerline Pain, MD   terazosin (HYTRIN) 2 MG capsule Take 1 capsule (2 mg total) by mouth at bedtime. 06/02/22 07/02/22  Donne Hazel, MD      Allergies    Acetaminophen and Niacin    Review of Systems   Review of Systems  Physical Exam Updated Vital Signs BP (!) 181/98 (BP Location: Left Arm)   Pulse 77   Temp 97.9 F (36.6 C) (Oral)   Resp 18   Ht '5\' 8"'$  (1.727 m)   Wt 79.4 kg   SpO2 99%   BMI 26.61 kg/m   Physical Exam Vitals and nursing note reviewed.  Constitutional:      General: He is not in acute distress.    Appearance: He is well-developed.  HENT:     Head: Normocephalic and atraumatic.  Eyes:     General:        Right eye: No discharge.        Left eye: No discharge.     Conjunctiva/sclera: Conjunctivae normal.  Cardiovascular:     Rate and Rhythm: Normal rate and regular rhythm.     Heart sounds: Normal heart sounds.  Pulmonary:     Effort: Pulmonary effort is normal.     Breath sounds: Normal breath sounds.  Abdominal:     General: There is distension.     Palpations: Abdomen is soft.  Tenderness: There is abdominal tenderness.     Comments: Suprapubic discomfort to palpation  Genitourinary:    Comments: Minimal amount of urine in bag, does not appear to be cloudy or bloody. Musculoskeletal:     Cervical back: Normal range of motion and neck supple.  Skin:    General: Skin is warm and dry.  Neurological:     Mental Status: He is alert.     ED Results / Procedures / Treatments   Labs (all labs ordered are listed, but only abnormal results are displayed) Labs Reviewed  URINALYSIS, ROUTINE W REFLEX MICROSCOPIC - Abnormal; Notable for the following components:      Result Value   Color, Urine STRAW (*)    Hgb urine dipstick LARGE (*)    Leukocytes,Ua LARGE (*)    WBC, UA >50 (*)    Bacteria, UA MANY (*)    All other components within normal limits    EKG None  Radiology No results found.  Procedures Procedures    Medications Ordered in  ED Medications - No data to display  ED Course/ Medical Decision Making/ A&P    Patient seen and examined personally in the triage room. History obtained directly from patient.  I also reviewed recent hospital H&P and discharge.  Labs/EKG: CBC, BMP, UA ordered by nursing protocol.  Imaging: Ordered bladder scan.  Medications/Fluids: None ordered  Most recent vital signs reviewed and are as follows: BP (!) 181/98 (BP Location: Left Arm)   Pulse 77   Temp 97.9 F (36.6 C) (Oral)   Resp 18   Ht '5\' 8"'$  (1.727 m)   Wt 79.4 kg   SpO2 99%   BMI 26.61 kg/m   Initial impression: Acute urinary retention secondary to blocked Foley catheter  11:29 PM Reassessment performed. Patient appears much improved.  Initial plan was for lab work, UA, bladder scan and replace Foley.  Prior to any of this getting done, the Foley catheter spontaneously began to drain.  Patient symptoms resolved.  I asked for postvoid residual, bladder scan was 0.  Lab work was canceled, except for UA.  This demonstrated white blood cells in the urine --however difficult to interpret as patient has chronic indwelling Foley catheter.  Patient does not have other signs of UTI or pyelonephritis including fever, flank pain.  Discussed with patient, will forego antibiotic treatment at this time.  Labs personally reviewed and interpreted including: UA, large hemoglobin with 0-5 red cells, urine is clear with greater than 50 white blood cells and bacteria.  Again difficult to interpret due to status of indwelling chronic Foley catheter.  Plan: Discharge to home.  Patient is anxious to go home.  Prescriptions written for: None  ED return instructions discussed: Return with recurrent symptoms.  Follow-up instructions discussed: Patient encouraged to follow-up with neurology next month as planned.                          Medical Decision Making Amount and/or Complexity of Data Reviewed Labs: ordered.   Patient with Foley  catheter obstruction, resolved.  No obvious sign of UTI or pyelonephritis.  Foley is draining well now.  No indication for further work-up or admission.  His symptoms are improved, he looks well and is anxious to go home.  The patient's vital signs, pertinent lab work and imaging were reviewed and interpreted as discussed in the ED course. Hospitalization was considered for further testing, treatments, or serial exams/observation. However as  patient is well-appearing, has a stable exam, and reassuring studies today, I do not feel that they warrant admission at this time. This plan was discussed with the patient who verbalizes agreement and comfort with this plan and seems reliable and able to return to the Emergency Department with worsening or changing symptoms.          Final Clinical Impression(s) / ED Diagnoses Final diagnoses:  Acute urinary retention    Rx / DC Orders ED Discharge Orders     None         Carlisle Cater, Hershal Coria 07/18/22 2332    Mesner, Corene Cornea, MD 07/19/22 0045

## 2022-07-18 NOTE — ED Triage Notes (Signed)
Patient has been using a foley catheter since July. Patient states today the catheter stopped draining. Patient complains of lower abdominal pain. Patient emptied catheter at 1800. Little drainage since then. Patient see's Dr Tresa Moore, urology. Also hypertensive in triage.

## 2022-07-27 ENCOUNTER — Telehealth: Payer: Self-pay | Admitting: *Deleted

## 2022-07-27 NOTE — Telephone Encounter (Signed)
     Patient  visit on 07/18/2022  at [facility] long ed  was for urinary retention   Have you been able to follow up with your primary care physician?  Yes patient was feeling better and will be getting foley changed in sept   The patient was able to obtain any needed medicine or equipment.  Are there diet recommendations that you are having difficulty following?  Patient expresses understanding of discharge instructions and education provided has no other needs at this time.    [facility] [phone] [address] , [place] [address] Email : [email or web address]

## 2022-08-01 DIAGNOSIS — R338 Other retention of urine: Secondary | ICD-10-CM | POA: Diagnosis not present

## 2022-08-03 ENCOUNTER — Other Ambulatory Visit: Payer: Self-pay | Admitting: Urology

## 2022-08-03 ENCOUNTER — Telehealth: Payer: Self-pay | Admitting: Cardiology

## 2022-08-03 NOTE — Telephone Encounter (Signed)
   Pre-operative Risk Assessment    Patient Name: Thomas Farrell  DOB: 1941/05/20 MRN: [id number]      Request for Surgical Clearance    Procedure:   Prostatectomy  Date of Surgery:  Clearance 08/26/22                                 Surgeon:    Dr. Alexis Frock Surgeon's Group or Practice Name:  [facility] Phone number:  [phone] Fax number:  [phone]   Type of Clearance Requested:   - Medical  - Pharmacy:  Hold Clopidogrel (Plavix)     Type of Anesthesia:  Not Indicated   Additional requests/questions:   Caller stated they will need to stop the Plavix 5-7 days before surgery  Signed, Heloise Beecham   08/03/2022, 1:52 PM

## 2022-08-05 ENCOUNTER — Telehealth: Payer: Self-pay

## 2022-08-05 NOTE — Telephone Encounter (Signed)
  Patient Consent for Virtual Visit        Thomas Farrell has provided verbal consent on 08/05/2022 for a virtual visit (video or telephone).   CONSENT FOR VIRTUAL VISIT FOR:  Thomas Farrell  By participating in this virtual visit I agree to the following:  I hereby voluntarily request, consent and authorize [facility] and its employed or contracted physicians, physician assistants, nurse practitioners or other licensed health care professionals (the Practitioner), to provide me with telemedicine health care services (the "Services") as deemed necessary by the treating Practitioner. I acknowledge and consent to receive the Services by the Practitioner via telemedicine. I understand that the telemedicine visit will involve communicating with the Practitioner through live audiovisual communication technology and the disclosure of certain medical information by electronic transmission. I acknowledge that I have been given the opportunity to request an in-person assessment or other available alternative prior to the telemedicine visit and am voluntarily participating in the telemedicine visit.  I understand that I have the right to withhold or withdraw my consent to the use of telemedicine in the course of my care at any time, without affecting my right to future care or treatment, and that the Practitioner or I may terminate the telemedicine visit at any time. I understand that I have the right to inspect all information obtained and/or recorded in the course of the telemedicine visit and may receive copies of available information for a reasonable fee.  I understand that some of the potential risks of receiving the Services via telemedicine include:  Delay or interruption in medical evaluation due to technological equipment failure or disruption; Information transmitted may not be sufficient (e.g. poor resolution of images) to allow for appropriate medical decision making by the  Practitioner; and/or  In rare instances, security protocols could fail, causing a breach of personal health information.  Furthermore, I acknowledge that it is my responsibility to provide information about my medical history, conditions and care that is complete and accurate to the best of my ability. I acknowledge that Practitioner's advice, recommendations, and/or decision may be based on factors not within their control, such as incomplete or inaccurate data provided by me or distortions of diagnostic images or specimens that may result from electronic transmissions. I understand that the practice of medicine is not an exact science and that Practitioner makes no warranties or guarantees regarding treatment outcomes. I acknowledge that a copy of this consent can be made available to me via my patient portal ([facility]), or I can request a printed copy by calling the office of [facility].    I understand that my insurance will be billed for this visit.   I have read or had this consent read to me. I understand the contents of this consent, which adequately explains the benefits and risks of the Services being provided via telemedicine.  I have been provided ample opportunity to ask questions regarding this consent and the Services and have had my questions answered to my satisfaction. I give my informed consent for the services to be provided through the use of telemedicine in my medical care

## 2022-08-05 NOTE — Telephone Encounter (Signed)
Patient is schedule for a telehelath visit on 08/10/22. Med rec and consent done

## 2022-08-05 NOTE — Telephone Encounter (Signed)
Primary Cardiologist:Mark Marlou Porch, MD  Chart reviewed for preoperative clearance requested for upcoming prostatectomy. He was last seen by Ambrose Pancoast, NP on 06/07/22 and a 1 month follow-up was recommended.   Preoperative team, please contact this patient and set up a phone call appointment or move up in office follow-up current scheduled for 09/16/22 for further preoperative risk assessment. Please obtain consent and complete medication review. Thank you for your help.   Per office protocol, he may hold Plavix for 5-7 days prior to procedure. Typical hold time is 5 days. He should resume Plavix as soon as hemodynamically stable following procedure.   Emmaline Life, NP-C     08/05/2022, 8:37 AM [address], [address] Office [phone] Fax [phone]

## 2022-08-10 ENCOUNTER — Ambulatory Visit: Payer: Medicare HMO | Attending: Cardiology | Admitting: Physician Assistant

## 2022-08-10 DIAGNOSIS — Z01810 Encounter for preprocedural cardiovascular examination: Secondary | ICD-10-CM

## 2022-08-10 NOTE — Progress Notes (Signed)
Virtual Visit via Telephone Note   Because of Thomas Farrell's co-morbid illnesses, he is at least at moderate risk for complications without adequate follow up.  This format is felt to be most appropriate for this patient at this time.  The patient did not have access to video technology/had technical difficulties with video requiring transitioning to audio format only (telephone).  All issues noted in this document were discussed and addressed.  No physical exam could be performed with this format.  Please refer to the patient's chart for his consent to telehealth for [facility].  Evaluation Performed:  Preoperative cardiovascular risk assessment _____________   Date:  08/10/2022   Patient ID:  Thomas Farrell, DOB 15-Jan-1941, MRN [id number] Patient Location:  Home Provider location:   Office  Primary Care Provider:  Lawerance Cruel, MD Primary Cardiologist:  Candee Furbish, MD  Chief Complaint / Patient Profile   81 y.o. y/o male with a h/o CAD s/p PTCA/DES to [facility] with wire induced dissection in distal RCA 2009, DES/PTCA to RCA 2013, GERD, spinal stenosis, prior mild cardiomyopathy (45-50% in 2009, normalized to 50-55%), PAF who is pending prostatectomy and presents today for telephonic preoperative cardiovascular risk assessment.  Past Medical History    Past Medical History:  Diagnosis Date   CAD in native artery    a. 2009 - s/p PTCA/DES to RCA with wire-induced dissection in distal RCA. b. 2013 - s/p DES to dRCA and cutting balloon angioplasty to ISR to mRCA   Cancer of skin of temple    "right"   Cardiomyopathy ([facility])    a. EF 45-50% in 2009. b. 50-55% by echo 04/2016.   CKD (chronic kidney disease), stage III (HCC)    GERD (gastroesophageal reflux disease)    Hyperlipidemia    Hypertension    Myocardial infarction [facility]) 2009   Obesity    Osteoarthritis    Past Surgical History:  Procedure Laterality Date   CARDIAC CATHETERIZATION  2009;  12/2011    CORONARY ANGIOPLASTY WITH STENT PLACEMENT  2009; 01/2012   "~ 2wk after each cath"   INGUINAL HERNIA REPAIR Right ~ 2010   [place] (PCI-S) N/A 01/25/2012   Procedure: PERCUTANEOUS CORONARY STENT INTERVENTION (PCI-S);  Surgeon: Jettie Booze, MD;  Location: [facility] CATH LAB;  Service: Cardiovascular;  Laterality: N/A;   SKIN CANCER EXCISION Right    temple   TONSILLECTOMY      Allergies  Allergies  Allergen Reactions   Acetaminophen Diarrhea and Nausea And Vomiting   Niacin Other (See Comments)    Flushing     History of Present Illness    Thomas Farrell is a 81 y.o. male who presents via audio/video conferencing for a telehealth visit today.  Pt was last seen in cardiology clinic on 06/07/22 by Ambrose Pancoast NP.  At that time TAYQUAN GASSMAN was seen for post-hospital f/u, had had new afib diagnosed during complex admission for acute renal failure and other significant medical issues. The patient did not wish to be placed on anticoagulation at that time. F/u monitor showed no recurrent afib and no anticoagulation was advised at that time. He does remain on DAPT per notes. The patient is now pending procedure as outlined above. Since his last visit, he reports he's felt well without any new cardiac symptoms. He is careful not to disrupt his catheter bag but does all his ADLs and participates in some yard work and has not had any recent  new cardiac symptoms.   Home Medications    Prior to Admission medications   Medication Sig Start Date End Date Taking? Authorizing Provider  aspirin EC 325 MG tablet Take 650 mg by mouth daily as needed for moderate pain.    [provider]  carvedilol (COREG) 3.125 MG tablet TAKE ONE TABLET BY MOUTH TWICE DAILY 02/18/22   Jerline Pain, MD  cephALEXin (KEFLEX) 500 MG capsule Take 500 mg by mouth See admin instructions. Take 500 mg twice daily on 10/4 and 10/5    [provider]  clopidogrel (PLAVIX) 75  MG tablet TAKE ONE TABLET BY MOUTH ONCE DAILY 11/19/21   Jerline Pain, MD  Coenzyme Q10 (CO Q-10) 100 MG CAPS Take 100 mg by mouth daily.    [provider]  ezetimibe (ZETIA) 10 MG tablet TAKE ONE TABLET BY MOUTH DAILY 02/18/22   Jerline Pain, MD  isosorbide mononitrate (IMDUR) 60 MG 24 hr tablet TAKE 1 AND 1/2 TABLETS BY MOUTH ONCE DAILY 02/18/22   Jerline Pain, MD  nitroGLYCERIN (NITROSTAT) 0.4 MG SL tablet Place 1 tablet (0.4 mg total) under the tongue every 5 (five) minutes as needed for chest pain (MAX 3 DOSES). 07/06/20   Burtis Junes, NP  pantoprazole (PROTONIX) 40 MG tablet Take 40 mg by mouth daily.    [provider]  rosuvastatin (CRESTOR) 40 MG tablet TAKE ONE TABLET BY MOUTH DAILY 02/18/22   Jerline Pain, MD  terazosin (HYTRIN) 2 MG capsule Take 1 capsule (2 mg total) by mouth at bedtime. Patient not taking: Reported on 08/09/2022 06/02/22 08/09/22  Donne Hazel, MD    Physical Exam    Vital Signs:  [name] does not have vital signs available for review today.  Given telephonic nature of communication, physical exam is limited. AAOx3. NAD. Normal affect.  Speech and respirations are unlabored.  Accessory Clinical Findings    None  Assessment & Plan    1.  Preoperative Cardiovascular Risk Assessment: RCRI 6.6% indicating moderate CV risk. The patient affirms he has been doing well without any new cardiac symptoms. They are able to achieve over 4 METS without cardiac limitations. Therefore, based on ACC/AHA guidelines, the patient would be at acceptable risk for the planned procedure without further cardiovascular testing. The patient was advised that if he develops new symptoms prior to surgery to contact our office to arrange for a follow-up visit, and he verbalized understanding.  In a prior unrelated clearance in 2022, Dr. Marlou Porch had given permission to hold Plavix for 5-7 days as requested. Since no interim MI/PCI, this recommendation  still applies from cardiac standpoint. Will defer to surgical team to relay final instructions to patient. I would also point out patient takes ASA periodically for pain (not specifically on maintenance dose for cardiac reason) so would recommend surgical team advise patient on duration to hold prior to surgery as well.  A copy of this note will be routed to requesting surgeon.  Time:   Today, I have spent 5 minutes with the patient with telehealth technology discussing medical history, symptoms, and management plan.     Charlie Pitter, PA-C  08/10/2022, 10:34 AM

## 2022-08-11 ENCOUNTER — Other Ambulatory Visit (HOSPITAL_COMMUNITY): Payer: Medicare HMO

## 2022-08-12 NOTE — Patient Instructions (Signed)
DUE TO COVID-19 ONLY TWO VISITORS  (aged 82 and older)  ARE ALLOWED TO COME WITH YOU AND STAY IN THE WAITING ROOM ONLY DURING PRE OP AND PROCEDURE.   **NO VISITORS ARE ALLOWED IN THE SHORT STAY AREA OR RECOVERY ROOM!!**  IF YOU WILL BE ADMITTED INTO THE HOSPITAL YOU ARE ALLOWED ONLY FOUR SUPPORT PEOPLE DURING VISITATION HOURS ONLY (7 AM -8PM)   The support person(s) must pass our screening, gel in and out, and wear a mask at all times, including in the patient's room. Patients must also wear a mask when staff or their support person are in the room. Visitors GUEST BADGE MUST BE WORN VISIBLY  One adult visitor may remain with you overnight and MUST be in the room by 8 P.M.     Your procedure is scheduled on: 08/26/22   Report to [facility] Main Entrance    Report to admitting at : 9:45 AM   Call this number if you have problems the morning of surgery [phone]   Clear liquids the day before surgery until: 9:00 AM DAY OF SURGERY. Drink plenty fluids the day of the prep.  Water Black Coffee (sugar ok, NO MILK/CREAM OR CREAMERS)  Tea (sugar ok, NO MILK/CREAM OR CREAMERS) regular and decaf                             Plain Jell-O (NO RED)                                           Fruit ices (not with fruit pulp, NO RED)                                     Popsicles (NO RED)                                                                  Juice: apple, WHITE grape, WHITE cranberry Sports drinks like Gatorade (NO RED)              FOLLOW BOWEL PREP AND ANY ADDITIONAL PRE OP INSTRUCTIONS YOU RECEIVED FROM YOUR SURGEON'S OFFICE!!!   Oral Hygiene is also important to reduce your risk of infection.                                    Remember - BRUSH YOUR TEETH THE MORNING OF SURGERY WITH YOUR REGULAR TOOTHPASTE   Do NOT smoke after Midnight   Take these medicines the morning of surgery with A SIP OF WATER: isosorbide,carvedilol,pantoprazole.  DO NOT TAKE ANY ORAL DIABETIC  MEDICATIONS DAY OF YOUR SURGERY  Bring CPAP mask and tubing day of surgery.                              You may not have any metal on your body including hair pins, jewelry, and body piercing  Do not wear lotions, powders, perfumes/cologne, or deodorant              Men may shave face and neck.   Do not bring valuables to the hospital. [place].   Contacts, dentures or bridgework may not be worn into surgery.   Bring small overnight bag day of surgery.   DO NOT [place]. PHARMACY WILL DISPENSE MEDICATIONS LISTED ON YOUR MEDICATION LIST TO YOU DURING YOUR ADMISSION [place]!    Patients discharged on the day of surgery will not be allowed to drive home.  Someone NEEDS to stay with you for the first 24 hours after anesthesia.   Special Instructions: Bring a copy of your healthcare power of attorney and living will documents         the day of surgery if you haven't scanned them before.              Please read over the following fact sheets you were given: IF YOU HAVE QUESTIONS ABOUT YOUR PRE-OP INSTRUCTIONS PLEASE CALL [phone]     [facility] - Preparing for Surgery Before surgery, you can play an important role.  Because skin is not sterile, your skin needs to be as free of germs as possible.  You can reduce the number of germs on your skin by washing with CHG (chlorahexidine gluconate) soap before surgery.  CHG is an antiseptic cleaner which kills germs and bonds with the skin to continue killing germs even after washing. Please DO NOT use if you have an allergy to CHG or antibacterial soaps.  If your skin becomes reddened/irritated stop using the CHG and inform your nurse when you arrive at Short Stay. Do not shave (including legs and underarms) for at least 48 hours prior to the first CHG shower.  You may shave your face/neck. Please follow these instructions carefully:  1.   Shower with CHG Soap the night before surgery and the  morning of Surgery.  2.  If you choose to wash your hair, wash your hair first as usual with your  normal  shampoo.  3.  After you shampoo, rinse your hair and body thoroughly to remove the  shampoo.                           4.  Use CHG as you would any other liquid soap.  You can apply chg directly  to the skin and wash                       Gently with a scrungie or clean washcloth.  5.  Apply the CHG Soap to your body ONLY FROM THE NECK DOWN.   Do not use on face/ open                           Wound or open sores. Avoid contact with eyes, ears mouth and genitals (private parts).                       Wash face,  Genitals (private parts) with your normal soap.             6.  Wash thoroughly, paying special attention to the area where your surgery  will be performed.  7.  Thoroughly rinse your body with warm water from the neck down.  8.  DO NOT shower/wash with your normal soap after using and rinsing off  the CHG Soap.                9.  Pat yourself dry with a clean towel.            10.  Wear clean pajamas.            11.  Place clean sheets on your bed the night of your first shower and do not  sleep with pets. Day of Surgery : Do not apply any lotions/deodorants the morning of surgery.  Please wear clean clothes to the hospital/surgery center.  FAILURE TO FOLLOW THESE INSTRUCTIONS MAY RESULT IN THE CANCELLATION OF YOUR SURGERY PATIENT SIGNATURE_________________________________  NURSE SIGNATURE__________________________________  ________________________________________________________________________

## 2022-08-15 ENCOUNTER — Encounter (HOSPITAL_COMMUNITY)
Admission: RE | Admit: 2022-08-15 | Discharge: 2022-08-15 | Disposition: A | Discharge status: Home / Self Care | Payer: Medicare HMO | Source: Ambulatory Visit | Attending: Urology | Admitting: Urology

## 2022-08-15 ENCOUNTER — Encounter (HOSPITAL_COMMUNITY): Payer: Self-pay

## 2022-08-15 ENCOUNTER — Other Ambulatory Visit: Payer: Self-pay

## 2022-08-15 VITALS — BP 188/87 | HR 60 | Temp 97.7°F | Ht 68.0 in | Wt 184.0 lb

## 2022-08-15 DIAGNOSIS — I10 Essential (primary) hypertension: Secondary | ICD-10-CM | POA: Diagnosis not present

## 2022-08-15 DIAGNOSIS — Z01812 Encounter for preprocedural laboratory examination: Secondary | ICD-10-CM | POA: Insufficient documentation

## 2022-08-15 LAB — BASIC METABOLIC PANEL
Anion gap: 8 (ref 5–15)
BUN: 22 mg/dL (ref 8–23)
CO2: 24 mmol/L (ref 22–32)
Calcium: 8.9 mg/dL (ref 8.9–10.3)
Chloride: 106 mmol/L (ref 98–111)
Creatinine, Ser: 1.02 mg/dL (ref 0.61–1.24)
GFR, Estimated: >60 mL/min (ref >60)
Glucose, Bld: 118 mg/dL — ABNORMAL HIGH (ref 70–99)
Potassium: 4.2 mmol/L (ref 3.5–5.1)
Sodium: 138 mmol/L (ref 135–145)

## 2022-08-15 LAB — CBC
HCT: 40.1 % (ref 39.0–52.0)
Hemoglobin: 12.5 g/dL — ABNORMAL LOW (ref 13.0–17.0)
MCH: 29.5 pg (ref 26.0–34.0)
MCHC: 31.2 g/dL (ref 30.0–36.0)
MCV: 94.6 fL (ref 80.0–100.0)
Platelets: 294 10*3/uL (ref 150–400)
RBC: 4.24 MIL/uL (ref 4.22–5.81)
RDW: 16.1 % — ABNORMAL HIGH (ref 11.5–15.5)
WBC: 9.5 10*3/uL (ref 4.0–10.5)
nRBC: 0 % (ref 0.0–0.2)

## 2022-08-15 NOTE — Progress Notes (Addendum)
For Short Stay: [place] appointment date: Date of COVID positive in last 80 days:  Bowel Prep reminder:   For Anesthesia: PCP - Dr. Nathanial Millman Cardiologist - Dr. Candee Furbish Clearance: Lisbeth Renshaw Dunn: PA: 08/10/22: EPIC Chest x-ray -  EKG - 05/28/22 Stress Test -  ECHO - 05/11/16 Cardiac Cath - [id number],2956 Pacemaker/ICD device last checked: Pacemaker orders received: Device Rep notified:  Spinal Cord Stimulator:  Sleep Study -  CPAP -   Fasting Blood Sugar -  Checks Blood Sugar _____ times a day Date and result of last Hgb A1c-  Blood Thinner Instructions:Plavix and Aspirin will be hold a week before surgery: Dr. Marlou Porch Aspirin Instructions: Last Dose:  Activity level: Can go up a flight of stairs and activities of daily living without stopping and without chest pain and/or shortness of breath   Able to exercise without chest pain and/or shortness of breath   Unable to go up a flight of stairs without chest pain and/or shortness of breath     Anesthesia review: Hx: CAD,MI,HTN,CKD III. BP at PST appointment was 185/94,188/87. Pt. Was advised to informed the MD. About this results.  Patient denies shortness of breath, fever, cough and chest pain at PAT appointment   Patient verbalized understanding of instructions that were given to them at the PAT appointment. Patient was also instructed that they will need to review over the PAT instructions again at home before surgery.

## 2022-08-18 NOTE — Progress Notes (Signed)
Anesthesia Chart Review   Case: [id number] Date/Time: 08/26/22 1145   Procedure: XI ROBOTIC ASSISTED SIMPLE PROSTATECTOMY - 3 HRS   Anesthesia type: Choice   Pre-op diagnosis: LARGE PROSTATE , RETENTION   Location: [place] [facility]   Surgeons: Alexis Frock, MD       DISCUSSION:81 y.o. former smoker with h/o HTN, CAD (s/p PTCA/DES to [facility] with wire induced dissection in distal RCA 2009, DES/PTCA to RCA 2013), CHF, CKD Stage III, large prostate with retention scheduled for above procedure 08/26/2022 with Dr. Alexis Frock.   Per cardiology preoperative evaluation 08/10/2022, "Preoperative Cardiovascular Risk Assessment: RCRI 6.6% indicating moderate CV risk. The patient affirms he has been doing well without any new cardiac symptoms. They are able to achieve over 4 METS without cardiac limitations. Therefore, based on ACC/AHA guidelines, the patient would be at acceptable risk for the planned procedure without further cardiovascular testing. The patient was advised that if he develops new symptoms prior to surgery to contact our office to arrange for a follow-up visit, and he verbalized understanding.   In a prior unrelated clearance in 2022, Dr. Marlou Porch had given permission to hold Plavix for 5-7 days as requested. Since no interim MI/PCI, this recommendation still applies from cardiac standpoint. Will defer to surgical team to relay final instructions to patient. I would also point out patient takes ASA periodically for pain (not specifically on maintenance dose for cardiac reason) so would recommend surgical team advise patient on duration to hold prior to surgery as well."  Pt advised to hold Plavix 7 days prior to procedure.   Anticipate pt can proceed with planned procedure barring acute status change.   VS: BP (!) 188/87   Pulse 60   Temp 36.5 C (Oral)   Ht '5\' 8"'$  (1.727 m)   Wt 83.5 kg   SpO2 98%   BMI 27.98 kg/m   PROVIDERS: Lawerance Cruel, MD is PCP   Cardiologist -  Dr. Candee Furbish LABS: Labs reviewed: Acceptable for surgery. (all labs ordered are listed, but only abnormal results are displayed)  Labs Reviewed  BASIC METABOLIC PANEL - Abnormal; Notable for the following components:      Result Value   Glucose, Bld 118 (*)    All other components within normal limits  CBC - Abnormal; Notable for the following components:   Hemoglobin 12.5 (*)    RDW 16.1 (*)    All other components within normal limits     IMAGES:   EKG:   CV: Echo 05/11/2016 - Left ventricle: The cavity size was normal. There was mild focal    basal hypertrophy of the septum. Systolic function was normal.    The estimated ejection fraction was in the range of 50% to 55%.    Mild hypokinesis of the inferolateral and inferior myocardium.  - Aortic valve: Trileaflet; mildly thickened, mildly calcified    leaflets. Transvalvular velocity was within the normal range.    There was no stenosis. There was no regurgitation.  - Mitral valve: Transvalvular velocity was within the normal range.    There was no evidence for stenosis. There was trivial    regurgitation.  - Left atrium: The atrium was moderately dilated.  - Right ventricle: The cavity size was normal. Wall thickness was    normal. Systolic function was normal.  - Atrial septum: No defect or patent foramen ovale was identified    by color flow Doppler.  - Tricuspid valve: There was trivial regurgitation.  -  Pulmonary arteries: Systolic pressure was within the normal    range. PA peak pressure: 25 mm Hg (S).  Past Medical History:  Diagnosis Date   CAD in native artery    a. 2009 - s/p PTCA/DES to RCA with wire-induced dissection in distal RCA. b. 2013 - s/p DES to dRCA and cutting balloon angioplasty to ISR to mRCA   Cancer of skin of temple    "right"   Cardiomyopathy ([place])    a. EF 45-50% in 2009. b. 50-55% by echo 04/2016.   CKD (chronic kidney disease), stage III (HCC)    GERD (gastroesophageal reflux  disease)    Hyperlipidemia    Hypertension    Myocardial infarction [facility]) 2009   Obesity    Osteoarthritis     Past Surgical History:  Procedure Laterality Date   CARDIAC CATHETERIZATION  2009;  12/2011   CORONARY ANGIOPLASTY WITH STENT PLACEMENT  2009; 01/2012   "~ 2wk after each cath"   INGUINAL HERNIA REPAIR Right ~ 2010   [place] (PCI-S) N/A 01/25/2012   Procedure: PERCUTANEOUS CORONARY STENT INTERVENTION (PCI-S);  Surgeon: Jettie Booze, MD;  [facility]: [facility] CATH LAB;  Service: Cardiovascular;  Laterality: N/A;   SKIN CANCER EXCISION Right    temple   TONSILLECTOMY      MEDICATIONS:  aspirin EC 325 MG tablet   carvedilol (COREG) 3.125 MG tablet   cephALEXin (KEFLEX) 500 MG capsule   clopidogrel (PLAVIX) 75 MG tablet   Coenzyme Q10 (CO Q-10) 100 MG CAPS   ezetimibe (ZETIA) 10 MG tablet   isosorbide mononitrate (IMDUR) 60 MG 24 hr tablet   nitroGLYCERIN (NITROSTAT) 0.4 MG SL tablet   pantoprazole (PROTONIX) 40 MG tablet   rosuvastatin (CRESTOR) 40 MG tablet   terazosin (HYTRIN) 2 MG capsule   No current facility-administered medications for this encounter.     Thomas Felix Ward, PA-C [facility] Pre-Surgical Testing [phone]

## 2022-08-18 NOTE — Anesthesia Preprocedure Evaluation (Addendum)
Anesthesia Evaluation  Patient identified by MRN, date of birth, ID band Patient awake    Reviewed: Allergy & Precautions, NPO status , Patient's Chart, lab work & pertinent test results  Airway Mallampati: III  TM Distance: >3 FB Neck ROM: Full    Dental  (+) Teeth Intact, Dental Advisory Given   Pulmonary former smoker,    breath sounds clear to auscultation       Cardiovascular hypertension, + CAD, + Past MI and + Cardiac Stents   Rhythm:Regular Rate:Normal     Neuro/Psych    GI/Hepatic Neg liver ROS, GERD  ,  Endo/Other  negative endocrine ROS  Renal/GU Renal disease     Musculoskeletal  (+) Arthritis ,   Abdominal Normal abdominal exam  (+)   Peds  Hematology negative hematology ROS (+)   Anesthesia Other Findings   Reproductive/Obstetrics                            Anesthesia Physical Anesthesia Plan  ASA: 3  Anesthesia Plan: General   Post-op Pain Management:    Induction: Intravenous  PONV Risk Score and Plan: 3 and Ondansetron, Dexamethasone and Midazolam  Airway Management Planned: Oral ETT  Additional Equipment: None  Intra-op Plan:   Post-operative Plan: Extubation in OR  Informed Consent: I have reviewed the patients History and Physical, chart, labs and discussed the procedure including the risks, benefits and alternatives for the proposed anesthesia with the patient or authorized representative who has indicated his/her understanding and acceptance.     Dental advisory given  Plan Discussed with: CRNA  Anesthesia Plan Comments: (See PAT note 08/15/2022)       Anesthesia Quick Evaluation

## 2022-08-24 DIAGNOSIS — R7301 Impaired fasting glucose: Secondary | ICD-10-CM | POA: Diagnosis not present

## 2022-08-24 DIAGNOSIS — I10 Essential (primary) hypertension: Secondary | ICD-10-CM | POA: Diagnosis not present

## 2022-08-24 DIAGNOSIS — E782 Mixed hyperlipidemia: Secondary | ICD-10-CM | POA: Diagnosis not present

## 2022-08-25 ENCOUNTER — Encounter (HOSPITAL_COMMUNITY): Payer: Self-pay | Admitting: Urology

## 2022-08-26 ENCOUNTER — Observation Stay (HOSPITAL_COMMUNITY)
Admission: RE | Admit: 2022-08-26 | Discharge: 2022-08-28 | Disposition: A | Discharge status: Home / Self Care | Payer: Medicare HMO | Attending: Urology | Admitting: Urology

## 2022-08-26 ENCOUNTER — Ambulatory Visit (HOSPITAL_COMMUNITY): Payer: Medicare HMO | Admitting: Physician Assistant

## 2022-08-26 ENCOUNTER — Other Ambulatory Visit: Payer: Self-pay

## 2022-08-26 ENCOUNTER — Encounter (HOSPITAL_COMMUNITY)
Admission: RE | Disposition: A | Discharge status: Home / Self Care | Payer: Self-pay | Source: Home / Self Care | Attending: Urology

## 2022-08-26 ENCOUNTER — Ambulatory Visit (HOSPITAL_BASED_OUTPATIENT_CLINIC_OR_DEPARTMENT_OTHER): Payer: Medicare HMO | Admitting: Anesthesiology

## 2022-08-26 ENCOUNTER — Encounter (HOSPITAL_COMMUNITY): Payer: Self-pay | Admitting: Urology

## 2022-08-26 DIAGNOSIS — Z87891 Personal history of nicotine dependence: Secondary | ICD-10-CM

## 2022-08-26 DIAGNOSIS — N183 Chronic kidney disease, stage 3 unspecified: Secondary | ICD-10-CM | POA: Insufficient documentation

## 2022-08-26 DIAGNOSIS — Z955 Presence of coronary angioplasty implant and graft: Secondary | ICD-10-CM | POA: Diagnosis not present

## 2022-08-26 DIAGNOSIS — I10 Essential (primary) hypertension: Secondary | ICD-10-CM

## 2022-08-26 DIAGNOSIS — I252 Old myocardial infarction: Secondary | ICD-10-CM | POA: Diagnosis not present

## 2022-08-26 DIAGNOSIS — E785 Hyperlipidemia, unspecified: Secondary | ICD-10-CM

## 2022-08-26 DIAGNOSIS — I129 Hypertensive chronic kidney disease with stage 1 through stage 4 chronic kidney disease, or unspecified chronic kidney disease: Secondary | ICD-10-CM | POA: Insufficient documentation

## 2022-08-26 DIAGNOSIS — R0600 Dyspnea, unspecified: Secondary | ICD-10-CM

## 2022-08-26 DIAGNOSIS — R338 Other retention of urine: Secondary | ICD-10-CM

## 2022-08-26 DIAGNOSIS — R079 Chest pain, unspecified: Secondary | ICD-10-CM

## 2022-08-26 DIAGNOSIS — N401 Enlarged prostate with lower urinary tract symptoms: Principal | ICD-10-CM | POA: Insufficient documentation

## 2022-08-26 DIAGNOSIS — R339 Retention of urine, unspecified: Secondary | ICD-10-CM | POA: Insufficient documentation

## 2022-08-26 DIAGNOSIS — I2510 Atherosclerotic heart disease of native coronary artery without angina pectoris: Secondary | ICD-10-CM

## 2022-08-26 DIAGNOSIS — C61 Malignant neoplasm of prostate: Secondary | ICD-10-CM | POA: Diagnosis not present

## 2022-08-26 DIAGNOSIS — N179 Acute kidney failure, unspecified: Secondary | ICD-10-CM

## 2022-08-26 DIAGNOSIS — I240 Acute coronary thrombosis not resulting in myocardial infarction: Secondary | ICD-10-CM

## 2022-08-26 DIAGNOSIS — Z85828 Personal history of other malignant neoplasm of skin: Secondary | ICD-10-CM | POA: Insufficient documentation

## 2022-08-26 HISTORY — PX: XI ROBOTIC ASSISTED SIMPLE PROSTATECTOMY: SHX6713

## 2022-08-26 LAB — HEMOGLOBIN AND HEMATOCRIT, BLOOD
HCT: 38.1 % — ABNORMAL LOW (ref 39.0–52.0)
Hemoglobin: 12.1 g/dL — ABNORMAL LOW (ref 13.0–17.0)

## 2022-08-26 LAB — ABO/RH: ABO/RH(D): O POS

## 2022-08-26 LAB — TYPE AND SCREEN
ABO/RH(D): O POS
Antibody Screen: NEGATIVE

## 2022-08-26 SURGERY — PROSTATECTOMY, SIMPLE, ROBOT-ASSISTED
Anesthesia: General

## 2022-08-26 MED ORDER — ONDANSETRON HCL 4 MG/2ML IJ SOLN: 4.0000 mg | INTRAMUSCULAR | Status: DC | PRN

## 2022-08-26 MED ORDER — FENTANYL CITRATE (PF) 250 MCG/5ML IJ SOLN
INTRAMUSCULAR | Status: AC
  Filled 2022-08-26: qty 5

## 2022-08-26 MED ORDER — FENTANYL CITRATE (PF) 100 MCG/2ML IJ SOLN
INTRAMUSCULAR | Status: DC | PRN
  Administered 2022-08-26 (×2): 100 ug via INTRAVENOUS

## 2022-08-26 MED ORDER — DIPHENHYDRAMINE HCL 50 MG/ML IJ SOLN: 12.5000 mg | Freq: Four times a day (QID) | INTRAMUSCULAR | Status: DC | PRN

## 2022-08-26 MED ORDER — ROCURONIUM BROMIDE 10 MG/ML (PF) SYRINGE
PREFILLED_SYRINGE | INTRAVENOUS | Status: DC | PRN
  Administered 2022-08-26: 60 mg via INTRAVENOUS
  Administered 2022-08-26: 40 mg via INTRAVENOUS

## 2022-08-26 MED ORDER — CEFAZOLIN SODIUM-DEXTROSE 2-4 GM/100ML-% IV SOLN
2.0000 g | INTRAVENOUS | Status: AC
  Administered 2022-08-26: 2 g via INTRAVENOUS
  Filled 2022-08-26: qty 100

## 2022-08-26 MED ORDER — ROCURONIUM BROMIDE 10 MG/ML (PF) SYRINGE
PREFILLED_SYRINGE | INTRAVENOUS | Status: AC
  Filled 2022-08-26: qty 10

## 2022-08-26 MED ORDER — BUPIVACAINE LIPOSOME 1.3 % IJ SUSP
INTRAMUSCULAR | Status: DC | PRN
  Administered 2022-08-26: 20 mL

## 2022-08-26 MED ORDER — HYDROCODONE-ACETAMINOPHEN 5-325 MG PO TABS: 1.0000 | ORAL_TABLET | Freq: Four times a day (QID) | ORAL | 0 refills | Status: DC | PRN

## 2022-08-26 MED ORDER — PHENYLEPHRINE HCL-NACL 20-0.9 MG/250ML-% IV SOLN
INTRAVENOUS | Status: DC | PRN
  Administered 2022-08-26: 20 ug/min via INTRAVENOUS

## 2022-08-26 MED ORDER — GENTAMICIN SULFATE 40 MG/ML IJ SOLN
5.0000 mg/kg | INTRAVENOUS | Status: AC
  Administered 2022-08-26: 400 mg via INTRAVENOUS
  Filled 2022-08-26: qty 10

## 2022-08-26 MED ORDER — MAGNESIUM CITRATE PO SOLN: 1.0000 | Freq: Once | ORAL | Status: DC

## 2022-08-26 MED ORDER — BUPIVACAINE LIPOSOME 1.3 % IJ SUSP
INTRAMUSCULAR | Status: AC
  Filled 2022-08-26: qty 20

## 2022-08-26 MED ORDER — PROPOFOL 10 MG/ML IV BOLUS
INTRAVENOUS | Status: AC
  Filled 2022-08-26: qty 20

## 2022-08-26 MED ORDER — MORPHINE SULFATE (PF) 2 MG/ML IV SOLN: 1.0000 mg | INTRAVENOUS | Status: DC | PRN

## 2022-08-26 MED ORDER — ISOSORBIDE MONONITRATE ER 60 MG PO TB24
90.0000 mg | ORAL_TABLET | Freq: Every day | ORAL | Status: DC
  Administered 2022-08-26 – 2022-08-28 (×3): 90 mg via ORAL
  Filled 2022-08-26 (×3): qty 1

## 2022-08-26 MED ORDER — LACTATED RINGERS IV SOLN: INTRAVENOUS | Status: DC | PRN

## 2022-08-26 MED ORDER — DIPHENHYDRAMINE HCL 12.5 MG/5ML PO ELIX: 12.5000 mg | ORAL_SOLUTION | Freq: Four times a day (QID) | ORAL | Status: DC | PRN

## 2022-08-26 MED ORDER — CEPHALEXIN 500 MG PO CAPS: 500.0000 mg | ORAL_CAPSULE | Freq: Two times a day (BID) | ORAL | 0 refills | Status: AC

## 2022-08-26 MED ORDER — LIDOCAINE HCL (PF) 2 % IJ SOLN
INTRAMUSCULAR | Status: AC
  Filled 2022-08-26: qty 5

## 2022-08-26 MED ORDER — HYDROMORPHONE HCL 2 MG/ML IJ SOLN
INTRAMUSCULAR | Status: AC
  Filled 2022-08-26: qty 1

## 2022-08-26 MED ORDER — PROPOFOL 10 MG/ML IV BOLUS
INTRAVENOUS | Status: DC | PRN
  Administered 2022-08-26: 110 mg via INTRAVENOUS

## 2022-08-26 MED ORDER — KCL IN DEXTROSE-NACL 10-5-0.45 MEQ/L-%-% IV SOLN
INTRAVENOUS | Status: DC
  Filled 2022-08-26 (×2): qty 1000

## 2022-08-26 MED ORDER — LIDOCAINE HCL 1 % IJ SOLN
INTRAMUSCULAR | Status: AC
  Filled 2022-08-26: qty 40

## 2022-08-26 MED ORDER — DEXAMETHASONE SODIUM PHOSPHATE 10 MG/ML IJ SOLN
INTRAMUSCULAR | Status: DC | PRN
  Administered 2022-08-26: 10 mg via INTRAVENOUS

## 2022-08-26 MED ORDER — HYDROMORPHONE HCL 1 MG/ML IJ SOLN
INTRAMUSCULAR | Status: DC | PRN
  Administered 2022-08-26: 1 mg via INTRAVENOUS

## 2022-08-26 MED ORDER — TRAMADOL HCL 50 MG PO TABS: 50.0000 mg | ORAL_TABLET | Freq: Four times a day (QID) | ORAL | Status: DC | PRN

## 2022-08-26 MED ORDER — STERILE WATER FOR IRRIGATION IR SOLN
Status: DC | PRN
  Administered 2022-08-26: 1000 mL

## 2022-08-26 MED ORDER — CHLORHEXIDINE GLUCONATE 0.12 % MT SOLN
15.0000 mL | Freq: Once | OROMUCOSAL | Status: AC
  Administered 2022-08-26: 15 mL via OROMUCOSAL

## 2022-08-26 MED ORDER — SUGAMMADEX SODIUM 200 MG/2ML IV SOLN
INTRAVENOUS | Status: DC | PRN
  Administered 2022-08-26: 200 mg via INTRAVENOUS

## 2022-08-26 MED ORDER — LACTATED RINGERS IV SOLN: INTRAVENOUS | Status: DC

## 2022-08-26 MED ORDER — LACTATED RINGERS IR SOLN
Status: DC | PRN
  Administered 2022-08-26: 1000 mL

## 2022-08-26 MED ORDER — SODIUM CHLORIDE (PF) 0.9 % IJ SOLN
INTRAMUSCULAR | Status: AC
  Filled 2022-08-26: qty 20

## 2022-08-26 MED ORDER — SODIUM CHLORIDE (PF) 0.9 % IJ SOLN
INTRAMUSCULAR | Status: DC | PRN
  Administered 2022-08-26: 20 mL

## 2022-08-26 MED ORDER — PANTOPRAZOLE SODIUM 40 MG PO TBEC
40.0000 mg | DELAYED_RELEASE_TABLET | Freq: Every day | ORAL | Status: DC
  Administered 2022-08-27 – 2022-08-28 (×2): 40 mg via ORAL
  Filled 2022-08-26 (×2): qty 1

## 2022-08-26 MED ORDER — ORAL CARE MOUTH RINSE: 15.0000 mL | Freq: Once | OROMUCOSAL | Status: AC

## 2022-08-26 MED ORDER — ROSUVASTATIN CALCIUM 20 MG PO TABS
40.0000 mg | ORAL_TABLET | Freq: Every day | ORAL | Status: DC
  Administered 2022-08-27 – 2022-08-28 (×2): 40 mg via ORAL
  Filled 2022-08-26 (×2): qty 2

## 2022-08-26 MED ORDER — LIDOCAINE 2% (20 MG/ML) 5 ML SYRINGE
INTRAMUSCULAR | Status: DC | PRN
  Administered 2022-08-26: 60 mg via INTRAVENOUS
  Administered 2022-08-26: 1.5 mg/kg/h via INTRAVENOUS

## 2022-08-26 MED ORDER — CARVEDILOL 3.125 MG PO TABS
3.1250 mg | ORAL_TABLET | Freq: Two times a day (BID) | ORAL | Status: DC
  Administered 2022-08-26 – 2022-08-28 (×4): 3.125 mg via ORAL
  Filled 2022-08-26 (×4): qty 1

## 2022-08-26 MED ORDER — ONDANSETRON HCL 4 MG/2ML IJ SOLN
INTRAMUSCULAR | Status: DC | PRN
  Administered 2022-08-26: 4 mg via INTRAVENOUS

## 2022-08-26 SURGICAL SUPPLY — 77 items
ADH SKN CLS APL DERMABOND .7 (GAUZE/BANDAGES/DRESSINGS) ×1
APL PRP STRL LF DISP 70% ISPRP (MISCELLANEOUS) ×1
APL SWBSTK 6 STRL LF DISP (MISCELLANEOUS) ×1
APPLICATOR COTTON TIP 6 STRL (MISCELLANEOUS) ×1 IMPLANT
APPLICATOR COTTON TIP 6IN STRL (MISCELLANEOUS) ×1
BAG COUNTER SPONGE SURGICOUNT (BAG) IMPLANT
BAG SPNG CNTER NS LX DISP (BAG)
CATH FOLEY 2WAY SLVR 18FR 30CC (CATHETERS) ×1 IMPLANT
CATH FOLEY 3WAY 30CC 24FR (CATHETERS) ×1
CATH TIEMANN FOLEY 18FR 5CC (CATHETERS) IMPLANT
CATH URTH STD 24FR FL 3W 2 (CATHETERS) ×1 IMPLANT
CHLORAPREP W/TINT 26 (MISCELLANEOUS) ×1 IMPLANT
CLIP LIGATING HEM O LOK PURPLE (MISCELLANEOUS) IMPLANT
CLOTH BEACON ORANGE TIMEOUT ST (SAFETY) ×1 IMPLANT
COVER SURGICAL LIGHT HANDLE (MISCELLANEOUS) ×1 IMPLANT
COVER TIP SHEARS 8 DVNC (MISCELLANEOUS) ×1 IMPLANT
COVER TIP SHEARS 8MM DA VINCI (MISCELLANEOUS) ×1
CUTTER ECHEON FLEX ENDO 45 340 (ENDOMECHANICALS) IMPLANT
DERMABOND ADVANCED .7 DNX12 (GAUZE/BANDAGES/DRESSINGS) ×1 IMPLANT
DRAIN CHANNEL RND F F (WOUND CARE) IMPLANT
DRAPE ARM DVNC X/XI (DISPOSABLE) ×4 IMPLANT
DRAPE COLUMN DVNC XI (DISPOSABLE) ×1 IMPLANT
DRAPE DA VINCI XI ARM (DISPOSABLE) ×4
DRAPE DA VINCI XI COLUMN (DISPOSABLE) ×1
DRAPE SURG IRRIG POUCH 19X23 (DRAPES) ×1 IMPLANT
DRSG TEGADERM 4X4.75 (GAUZE/BANDAGES/DRESSINGS) ×2 IMPLANT
ELECT PENCIL ROCKER SW 15FT (MISCELLANEOUS) ×1 IMPLANT
ELECT REM PT RETURN 15FT ADLT (MISCELLANEOUS) ×1 IMPLANT
GAUZE SPONGE 4X4 12PLY STRL (GAUZE/BANDAGES/DRESSINGS) IMPLANT
GLOVE BIO SURGEON STRL SZ 6.5 (GLOVE) ×1 IMPLANT
GLOVE BIOGEL PI IND STRL 7.5 (GLOVE) ×1 IMPLANT
GLOVE SURG LX STRL 7.5 STRW (GLOVE) ×2 IMPLANT
GOWN SRG XL LVL 4 BRTHBL STRL (GOWNS) ×1 IMPLANT
GOWN STRL NON-REIN XL LVL4 (GOWNS) ×1
HOLDER FOLEY CATH W/STRAP (MISCELLANEOUS) ×1 IMPLANT
IRRIG SUCT STRYKERFLOW 2 WTIP (MISCELLANEOUS) ×1
IRRIGATION SUCT STRKRFLW 2 WTP (MISCELLANEOUS) ×1 IMPLANT
IV LACTATED RINGERS 1000ML (IV SOLUTION) ×1 IMPLANT
KIT TURNOVER KIT A (KITS) IMPLANT
NDL INSUFFLATION 14GA 120MM (NEEDLE) ×1 IMPLANT
NEEDLE INSUFFLATION 14GA 120MM (NEEDLE) ×1 IMPLANT
PACK ROBOT UROLOGY CUSTOM (CUSTOM PROCEDURE TRAY) ×1 IMPLANT
PAD POSITIONING PINK XL (MISCELLANEOUS) ×1 IMPLANT
PENCIL SMOKE EVACUATOR (MISCELLANEOUS) IMPLANT
PORT ACCESS TROCAR AIRSEAL 12 (TROCAR) ×1 IMPLANT
PORT ACCESS TROCAR AIRSEAL 5M (TROCAR) ×1
RELOAD STAPLE 45 2.6 WHT THIN (STAPLE) IMPLANT
RELOAD STAPLE 45 4.1 GRN THCK (STAPLE) IMPLANT
SEAL CANN UNIV 5-8 DVNC XI (MISCELLANEOUS) ×4 IMPLANT
SEAL XI 5MM-8MM UNIVERSAL (MISCELLANEOUS) ×4
SET TRI-LUMEN FLTR TB AIRSEAL (TUBING) ×1 IMPLANT
SOL PREP POV-IOD 4OZ 10% (MISCELLANEOUS) IMPLANT
SOLUTION ELECTROLUBE (MISCELLANEOUS) ×1 IMPLANT
SPIKE FLUID TRANSFER (MISCELLANEOUS) ×1 IMPLANT
SPONGE T-LAP 4X18 ~~LOC~~+RFID (SPONGE) ×1 IMPLANT
STAPLE RELOAD 45 GRN (STAPLE) IMPLANT
STAPLE RELOAD 45 WHT (STAPLE) ×1 IMPLANT
STAPLE RELOAD 45MM GREEN (STAPLE)
STAPLE RELOAD 45MM WHITE (STAPLE) ×1
SUT ETHILON 3 0 PS 1 (SUTURE) ×1 IMPLANT
SUT MNCRL AB 4-0 PS2 18 (SUTURE) ×2 IMPLANT
SUT PDS AB 1 CT1 27 (SUTURE) ×2 IMPLANT
SUT V-LOC BARB 180 2/0GR6 GS22 (SUTURE) ×2
SUT VIC AB 0 CT1 27 (SUTURE) ×4
SUT VIC AB 0 CT1 27XBRD ANTBC (SUTURE) ×3 IMPLANT
SUT VIC AB 2-0 SH 27 (SUTURE) ×1
SUT VIC AB 2-0 SH 27X BRD (SUTURE) ×1 IMPLANT
SUT VICRYL 0 UR6 27IN ABS (SUTURE) ×1 IMPLANT
SUT VLOC BARB 180 ABS3/0GR12 (SUTURE) ×2
SUTURE V-LC BRB 180 2/0GR6GS22 (SUTURE) ×2 IMPLANT
SUTURE VLOC BRB 180 ABS3/0GR12 (SUTURE) ×2 IMPLANT
SYS BAG RETRIEVAL 10MM (BASKET) ×1
SYSTEM BAG RETRIEVAL 10MM (BASKET) ×1 IMPLANT
TOWEL OR NON WOVEN STRL DISP B (DISPOSABLE) ×1 IMPLANT
TROCAR Z-THREAD FIOS 5X100MM (TROCAR) IMPLANT
TROCAR Z-THREAD OPTICAL 5X100M (TROCAR) IMPLANT
WATER STERILE IRR 1000ML POUR (IV SOLUTION) ×1 IMPLANT

## 2022-08-26 NOTE — Anesthesia Postprocedure Evaluation (Signed)
Anesthesia Post Note  Patient: Thomas Farrell  Procedure(s) Performed: XI ROBOTIC ASSISTED SIMPLE PROSTATECTOMY     Patient location during evaluation: PACU Anesthesia Type: General Level of consciousness: awake and alert Pain management: pain level controlled Vital Signs Assessment: post-procedure vital signs reviewed and stable Respiratory status: spontaneous breathing, nonlabored ventilation, respiratory function stable and patient connected to nasal cannula oxygen Cardiovascular status: blood pressure returned to baseline and stable Postop Assessment: no apparent nausea or vomiting Anesthetic complications: no   No notable events documented.  Last Vitals:  Vitals:   08/26/22 1430 08/26/22 1445  BP: (!) 171/87 133/72  Pulse: 74 (!) 55  Resp: 13 11  Temp: 36.4 C   SpO2: 97% 93%    Last Pain:  Vitals:   08/26/22 1430  TempSrc:   PainSc: 0-No pain                 Effie Berkshire

## 2022-08-26 NOTE — Op Note (Signed)
NAME: Thomas Farrell, Thomas Farrell MEDICAL RECORD NO: [id number] ACCOUNT NO: [id number] DATE OF BIRTH: 1941-09-17 FACILITY: [facility] LOCATION: [facility] PHYSICIAN: Alexis Frock, MD  Operative Report   DATE OF PROCEDURE: 08/26/2022  PREOPERATIVE DIAGNOSIS:  Very large prostatic hypertrophy with refractory urinary retention.  PROCEDURE:  Robotic-assisted laparoscopic simple prostatectomy.  ESTIMATED BLOOD LOSS:  160 mL  COMPLICATIONS:  None.  SPECIMEN:  Prostate adenoma for permanent pathology.  FINDINGS:   1.  Prior right inguinal area mesh hernia repair. 2.  Bilobar prostatic hypertrophy. 3.  Wide open urinary channel from the bladder neck to the membranous urethra, following simple prostatectomy.  DRAINS:   1.  Jackson-Pratt drain to bulb suction. 2.  Foley catheter to straight drain.  INDICATIONS:  The patient is a pleasant 81 year old man.  He does have cardiovascular comorbidity that is well controlled.  He has a long history of obstructive voiding symptoms.  He developed frank urinary retention several months ago. He has been  catheter dependent.  He has been on maximal medical therapy that he could tolerate, and despite this, has failed trial of void times several.  He underwent urodynamic study, which corroborated obstructive picture with somewhat weak and preserved detrusor  pressure, but no flow. Options were discussed including intermittent versus chronic catheters versus outlet procedures with simple prostatectomy being preferred given his prostate greater than 100 grams and he wished to proceed with the latter with goal  of catheter free.  He has cardiac clearance.  He has been holding his Plavix as directed and understands that his age, cardiovascular comorbidity increases his risks.  Informed consent was obtained and placed in medical record.   PROCEDURE IN DETAIL: The patient being himself verified, procedure being robotic simple prostatectomy was confirmed.  Procedure timeout was  performed.  Intravenous antibiotics were administered.  General endotracheal anesthesia induced.  The patient was  placed in low lithotomy position.  Sterile field was created, prepped and draped the patient's penis, perineum, and proximal thighs and for his anxiety, Foley catheter was removed and his infra-xiphoid abdomen using chlorhexidine gluconate after clipper  shaving.  He was further fastened to operating table using 3-inch tape over foam padding across supraxiphoid chest.  His arms were tucked to the side with gel roll.  A test of steep Trendelenburg positioning was performed and found to be suitable  position.  A new Foley catheter was placed free to straight drain.  High flow, low pressure pneumoperitoneum was obtained using Veress technique in the supraumbilical midline approximately 4 fingerbreadths superior to the umbilicus away from his prior  infraumbilical scar.  Laparoscopic examination of peritoneal cavity revealed no significant adhesions, no visceral injury.  There was some contour abnormality in the right space of Retzius inguinal region consistent with known hernia repair.  Additional  ports were placed as follows:  Right paramedian 8 mm robotic port, right far lateral 12 mm assist port, right paramedian 5 mm suction port, left paramedian 8 mm robotic port, left far lateral 8 mm robotic port.  Robot was docked, having passed the  electronic checks.  Attention was directed at development of space of Retzius.  Incision was made lateral to the left medial umbilical ligament from the midline towards the internal ring coursing along the iliac vessels towards the area of the left vas  deferens, which was ligated and used as a medial bucket handle and the left bladder wall swept away from the pelvic sidewall towards area of the endopelvic fascia on the left side.  A  mirror image dissection was performed on the right side, there was  mesh in the space of Retzius, which appeared to be  adequately fixing a direct inguinal hernia medial to the internal ring.  Purposeful transection of the mesh was performed, keeping greater of the crucial aspects of the mesh apposition to his defect.   Exquisite care was taken to avoid any bladder perforation, which did not occur.  This exposed the anterior base of the bladder neck-prostate area, which was defatted and showed [place] the dorsal venous complex, which was controlled using green load  stapler, taking exquisite care to avoid membranous urethral injury, which did not occur.  Did identify the true bladder neck and made an inverted U cystotomy approximately 2 cm proximal to the true bladder neck, which allowed excellent visualization of  the large bilobar prostate adenoma.  Ureteral orifices and intertrigonal ridge were noted as well.  Three stay sutures of 0 Vicryl were placed into the right lobe, left lobe, posterior medial aspect of the prostate respectively and attention was directed  at posterior dissection.  The posterior adenoma plane was entered approximately 1.5 cm distal to the trigonal ridge beginning in the midline and the adenoma plane was developed all the way towards the prostate apex.  This was then carried laterally to  the 3 o'clock and 9 o'clock positions respectively.  Additional mucosal incision was made circumferentially at the level of the adenoma, leaving a good capsular rim and the remainder of the adenoma was freed up from the capsule towards the 12 o'clock  position, both sides connecting in the midline, The adenoma was placed on maximal superior traction and final apical excision was performed by incising at the anterior commissure in a filet type fashion.  This exposed as much better visualization of the  true apex which was released from the membranous urethra.  This completely freed up the large adenoma specimen and was placed in EndoCatch bag for later retrieval.  Additional hemostasis was achieved with point coagulation  current.  There was no evidence  of capsular violation.  Hemostasis was quite good.  Attention was directed at urethral advancement, 3-0 double arm V-Loc suture was used to reapproximate the posterior bladder mucosa to the area of the membranous urethra, essentially forming a 50%  circumference posterior anastomosis.  This completely covered the posterior prostatic fossa and created an excellent wide mucosal bridge from the membranous urethra to the bladder, thus provided excellent hemostasis.  Ureteral orifices were visualized  following this and found to be visibly patent.  The cystotomy was then closed using 2 separate running suture lines of 2-0 Vicryl meeting in the midline and tying together and a new 3-way type Foley catheter was then placed per urethra.  30 mL water in  the balloon.  Irrigation port plugged, which  irrigated quantitatively.  I was quite happy with the extirpated and reconstructive portions of the surgery today.  Hemostasis was excellent.  Sponge and needle counts were correct.  A closed suction drain  was brought through previous left lateral most robotic site into the peritoneal cavity.  Robot was then undocked.  The previous right lateral most assistant port site was closed with fascia using Carter-Thomason suture passer and 0 Vicryl.  Specimen was  retrieved by extending the previous camera port site inferiorly for a total distance of approximately 4 cm, removing the large adenoma specimen, setting aside for permanent pathology.  Extraction site was closed at the level of fascia using  figure-of-eight PDS  x3 followed by reapproximation of Scarpa's with running Vicryl.  All incision sites were infiltrated with dilute lipolyzed Marcaine and closed at the level of the skin using subcuticular Monocryl followed by Dermabond.  Procedure was  then terminated.  The patient tolerated the procedure well, no immediate periprocedural complications.  The patient was taken to [place] [facility] in stable condition with plan for observation admission.      PAA D: 08/26/2022 2:24:35 pm T: 08/26/2022 10:47:00 pm  JOB: [id number]/ [id number]

## 2022-08-26 NOTE — Anesthesia Procedure Notes (Signed)
Procedure Name: Intubation Date/Time: 08/26/2022 11:34 AM  Performed by: Renato Shin, CRNAPre-anesthesia Checklist: Patient identified, Emergency Drugs available, Suction available and Patient being monitored Patient Re-evaluated:Patient Re-evaluated prior to induction Oxygen Delivery Method: Circle system utilized Preoxygenation: Pre-oxygenation with 100% oxygen Induction Type: IV induction Ventilation: Mask ventilation without difficulty Laryngoscope Size: Miller and 3 Grade View: Grade I Tube type: Oral Tube size: 7.5 mm Number of attempts: 1 Airway Equipment and Method: Stylet and Oral airway Placement Confirmation: ETT inserted through vocal cords under direct vision, positive ETCO2 and breath sounds checked- equal and bilateral Secured at: 23 cm Tube secured with: Tape Dental Injury: Teeth and Oropharynx as per pre-operative assessment

## 2022-08-26 NOTE — H&P (Signed)
Thomas Farrell is an 81 y.o. male.    Chief Complaint: Pre-Op Simple Prostatectomy  HPI:   1 - Lower Urinary Tract Symptoms / Urinary Retention - long h/o obstructive symptoms. Did not tolerate tamsulosin or finastierde. Devleped frank retention 05/2022 prompting catheter placement and hospitalization with acute reanl failure that recovered. Placed on terazosin which he is amazingly tolerating. CT spine 2023 (does NOT show prostae completely enlarge bilobar (estimate approx 80-100gm). Prior PVR 2021 <22m. UDS 06/2023 with obstructed pattern PDet 40s with 0 flow. CT 06/2022 prostate vol 117gm, no medain.   PMH sig for CAD/AFib/Plavix (follows [facility]. [facility], not lmiting), spinal stenosis (some mild foot drop), lap Rt inguinal hernia repair. 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 AShannan HarperMD.   Today " Thomas Farrell" is seen to proceed with simple prostatectomy. NO interval fevers. He is holding plavix as reqeusted. Cardiac clearance on file, moderate risk.   Past Medical History:  Diagnosis Date   CAD in native artery    a. 2009 - s/p PTCA/DES to RCA with wire-induced dissection in distal RCA. b. 2013 - s/p DES to dRCA and cutting balloon angioplasty to ISR to mRCA   Cancer of skin of temple    "right"   Cardiomyopathy ([facility]    a. EF 45-50% in 2009. b. 50-55% by echo 04/2016.   CKD (chronic kidney disease), stage III (HCC)    GERD (gastroesophageal reflux disease)    Hyperlipidemia    Hypertension    Myocardial infarction ([facility] 2009   Obesity    Osteoarthritis     Past Surgical History:  Procedure Laterality Date   CARDIAC CATHETERIZATION  2009;  12/2011   CORONARY ANGIOPLASTY WITH STENT PLACEMENT  2009; 01/2012   "~ 2wk after each cath"   INGUINAL HERNIA REPAIR Right ~ 2010   [place](PCI-S) N/A 01/25/2012   Procedure: PERCUTANEOUS CORONARY STENT INTERVENTION (PCI-S);  Surgeon: JJettie Booze MD;  [facility];  Service: Cardiovascular;  Laterality: N/A;   SKIN CANCER EXCISION Right    temple   TONSILLECTOMY      Family History  Problem Relation Age of Onset   Heart attack Father    Hypertension Other    Heart attack Brother    Heart attack Paternal Uncle    Stroke Neg Hx    Social History:  reports that he quit smoking about 20 years ago. His smoking use included cigarettes. He has a 172.00 pack-year smoking history. He has never used smokeless tobacco. He reports current alcohol use of about 2.0 standard drinks of alcohol per week. He reports that he does not use drugs.  Allergies:  Allergies  Allergen Reactions   Acetaminophen Diarrhea and Nausea And Vomiting   Niacin Other (See Comments)    Flushing     No medications prior to admission.    No results found for this or any previous visit (from the past 48 hour(s)). No results found.  Review of Systems  Constitutional:  Negative for chills and fever.  All other systems reviewed and are negative.   There were no vitals taken for this visit. Physical Exam Vitals reviewed.  HENT:     Head: Normocephalic.     Mouth/Throat:     Mouth: Mucous membranes are moist.  Cardiovascular:     Rate and Rhythm: Normal rate.  Pulmonary:     Effort: Pulmonary effort is normal.  Abdominal:  General: Abdomen is flat.  Genitourinary:    Comments: Catheter in place with non-foul urine.  Musculoskeletal:        General: Normal range of motion.     Cervical back: Normal range of motion.  Neurological:     General: No focal deficit present.     Mental Status: He is alert.  Psychiatric:        Mood and Affect: Mood normal.      Assessment/Plan  Proceed as planned with simple prostatectomy. Risks, benefits, alternatives, expected peri-op course discussed previously and reiterated today. He understands that his age and CV comorbidity increases risk of all peri-op complications including MI and mortality.   Alexis Frock,  MD 08/26/2022, 7:09 AM

## 2022-08-26 NOTE — Discharge Instructions (Addendum)
1- Drain Sites - You may have some mild persistent drainage from old drain site for several days, this is normal. This can be covered with cotton gauze for convenience.  2 - Stiches - Your stitches are all dissolvable. You may notice a "loose thread" at your incisions, these are normal and require no intervention. You may cut them flush to the skin with fingernail clippers if needed for comfort.  3 - Diet - No restrictions  4 - Activity - No heavy lifting / straining (any activities that require valsalva or "bearing down") x 4 weeks. Otherwise, no restrictions.  5 - Bathing - You may shower immediately. Do not take a bath or get into swimming pool where incision sites are submersed in water x 4 weeks.   6 - Catheter - Will remain in place until removed at your next appointment. It may be cleaned with soap and water in the shower. It may be disconnected from the drain bag while in the shower to avoid tripping over the tube. You may apply Neosporin or Vaseline ointment as needed to the tip of the penis where the catheter inserts to reduce friction and irritation in this spot.   7 - HOLD PLAVIX until instructed by Dr. Tresa Moore at post-op appointment   8 - When to Call the Doctor - Call MD for any fever >102, any acute wound problems, or any severe nausea / vomiting. You can call the [facility] [phone]) 24 hours a day 365 days a year. It will roll-over to the answering service and on-call physician after hours.

## 2022-08-26 NOTE — Brief Op Note (Signed)
08/26/2022  2:14 PM  PATIENT:  Thomas Farrell  81 y.o. male  PRE-OPERATIVE DIAGNOSIS:  LARGE PROSTATE , RETENTION  POST-OPERATIVE DIAGNOSIS:  LARGE PROSTATE , RETENTION  PROCEDURE:  Procedure(s) with comments: XI ROBOTIC ASSISTED SIMPLE PROSTATECTOMY (N/A) - 3 HRS  SURGEON:  Surgeon(s) and Role:    Alexis Frock, MD - Primary  PHYSICIAN ASSISTANT:   ASSISTANTS: Funbi     ANESTHESIA:   local and general  EBL:  200 mL   BLOOD ADMINISTERED:none  DRAINS:  1 - JP to bulb; 2- Foley to gravity    LOCAL MEDICATIONS USED:  MARCAINE     SPECIMEN:  Source of Specimen:  simple prostatectomy  DISPOSITION OF SPECIMEN:  PATHOLOGY  COUNTS:  YES  TOURNIQUET:  * No tourniquets in log *  DICTATION: .Other Dictation: Dictation Number [id number]  PLAN OF CARE: Admit for overnight observation  PATIENT DISPOSITION:  PACU - hemodynamically stable.   Delay start of Pharmacological VTE agent (>24hrs) due to surgical blood loss or risk of bleeding: yes

## 2022-08-26 NOTE — Transfer of Care (Signed)
Immediate Anesthesia Transfer of Care Note  Patient: Thomas Farrell  Procedure(s) Performed: Procedure(s) with comments: XI ROBOTIC ASSISTED SIMPLE PROSTATECTOMY (N/A) - 3 HRS  Patient Location: PACU  Anesthesia Type:General  Level of Consciousness:  sedated, patient cooperative and responds to stimulation  Airway & Oxygen Therapy:Patient Spontanous Breathing and Patient connected to face mask oxgen  Post-op Assessment:  Report given to PACU RN and Post -op Vital signs reviewed and stable  Post vital signs:  Reviewed and stable  Last Vitals:  Vitals:   08/26/22 0900 08/26/22 1430  BP: (!) 148/78 (!) 171/87  Pulse: (!) 58 74  Resp: 18 13  Temp: 36.7 C   SpO2: 94% 09%    Complications: No apparent anesthesia complications

## 2022-08-27 ENCOUNTER — Encounter (HOSPITAL_COMMUNITY): Payer: Self-pay | Admitting: Urology

## 2022-08-27 DIAGNOSIS — Z85828 Personal history of other malignant neoplasm of skin: Secondary | ICD-10-CM | POA: Diagnosis not present

## 2022-08-27 DIAGNOSIS — I129 Hypertensive chronic kidney disease with stage 1 through stage 4 chronic kidney disease, or unspecified chronic kidney disease: Secondary | ICD-10-CM | POA: Diagnosis not present

## 2022-08-27 DIAGNOSIS — N183 Chronic kidney disease, stage 3 unspecified: Secondary | ICD-10-CM | POA: Diagnosis not present

## 2022-08-27 DIAGNOSIS — Z955 Presence of coronary angioplasty implant and graft: Secondary | ICD-10-CM | POA: Diagnosis not present

## 2022-08-27 DIAGNOSIS — Z87891 Personal history of nicotine dependence: Secondary | ICD-10-CM | POA: Diagnosis not present

## 2022-08-27 DIAGNOSIS — N401 Enlarged prostate with lower urinary tract symptoms: Secondary | ICD-10-CM | POA: Diagnosis not present

## 2022-08-27 DIAGNOSIS — R339 Retention of urine, unspecified: Secondary | ICD-10-CM | POA: Diagnosis not present

## 2022-08-27 DIAGNOSIS — I2510 Atherosclerotic heart disease of native coronary artery without angina pectoris: Secondary | ICD-10-CM | POA: Diagnosis not present

## 2022-08-27 LAB — BASIC METABOLIC PANEL
Anion gap: 8 (ref 5–15)
BUN: 18 mg/dL (ref 8–23)
CO2: 23 mmol/L (ref 22–32)
Calcium: 8.5 mg/dL — ABNORMAL LOW (ref 8.9–10.3)
Chloride: 103 mmol/L (ref 98–111)
Creatinine, Ser: 1.19 mg/dL (ref 0.61–1.24)
GFR, Estimated: >60 mL/min (ref >60)
Glucose, Bld: 160 mg/dL — ABNORMAL HIGH (ref 70–99)
Potassium: 4.4 mmol/L (ref 3.5–5.1)
Sodium: 134 mmol/L — ABNORMAL LOW (ref 135–145)

## 2022-08-27 LAB — HEMOGLOBIN AND HEMATOCRIT, BLOOD
HCT: 34.5 % — ABNORMAL LOW (ref 39.0–52.0)
Hemoglobin: 11.2 g/dL — ABNORMAL LOW (ref 13.0–17.0)

## 2022-08-27 MED ORDER — SIMETHICONE 80 MG PO CHEW
160.0000 mg | CHEWABLE_TABLET | Freq: Four times a day (QID) | ORAL | Status: DC | PRN
  Administered 2022-08-27: 160 mg via ORAL
  Filled 2022-08-27: qty 2

## 2022-08-27 NOTE — Progress Notes (Addendum)
1 Day Post-Op Subjective: No acute events overnight.  Patient is afebrile and hemodynamically stable.  Making adequate urine.  150 cc from [place].  Labs unremarkable today.  Tolerating a regular diet. has not been out of bed yet  Objective: Vital signs in last 24 hours: Temp:  [97.6 F (36.4 C)-98.5 F (36.9 C)] 97.8 F (36.6 C) (10/07 0424) Pulse Rate:  [55-97] 73 (10/07 1008) Resp:  [11-20] 20 (10/07 0424) BP: (133-171)/(72-89) 161/89 (10/07 1008) SpO2:  [93 %-97 %] 97 % (10/07 0424)  Intake/Output from previous day: 10/06 0701 - 10/07 0700 In: 2767.3 [I.V.:2557.3; IV Piggyback:210] Out: 1750 [Urine:1400; Drains:150; Blood:200] Intake/Output this shift: No intake/output data recorded.  Physical Exam:  General: Alert and oriented CV: Regular rate Lungs: Normal work of breathing on room air Abdomen: Soft, ND, appropriately tender  Incisions: clean/dry/intact Ext: NT, No erythema  Lab Results: Recent Labs    08/26/22 1446 08/27/22 0521  HGB 12.1* 11.2*  HCT 38.1* 34.5*   BMET Recent Labs    08/27/22 0521  NA 134*  K 4.4  CL 103  CO2 23  GLUCOSE 160*  BUN 18  CREATININE 1.19  CALCIUM 8.5*     Studies/Results: No results found.  Assessment/Plan: BPH with refractory urinary retention status post robotic assisted laparoscopic simple prostatectomy on 08/26/2022  -Advance diet as tolerated - PRN pain medications -Medlock IV fluids -Out of bed, ambulate, IS -Continue Foley catheter status post urologic procedure - Holding home plavix at this time.    LOS: 0 days   Obafunbi Abimbola 08/27/2022, 11:10 AM  I have seen and examined the patient and agree with the above assessment and plan.  Pain controlled. Feels some bloating. No nausea or emesis. Passing flatus. Tolerating regular diet. -Hold plavix. Keep foley. Keep in house today.  Matt R. [facility]  Pager: [phone]

## 2022-08-27 NOTE — Plan of Care (Signed)
  Problem: Education: Goal: Knowledge of the procedure and recovery process will improve Outcome: Progressing   Problem: Urinary Elimination: Goal: Ability to avoid or minimize complications of infection will improve Outcome: Progressing Goal: Ability to achieve and maintain urine output will improve Outcome: Progressing   Problem: Clinical Measurements: Goal: Ability to maintain clinical measurements within normal limits will improve Outcome: Progressing Goal: Diagnostic test results will improve Outcome: Progressing

## 2022-08-27 NOTE — Progress Notes (Signed)
Mobility Specialist - Progress Note   08/27/22 1300  Mobility  Activity Ambulated independently in hallway  Activity Response Tolerated well  Distance Ambulated (ft) 800 ft  $Mobility charge 1 Mobility  Level of Assistance Modified independent, requires aide device or extra time  Assistive Device None  Range of Motion/Exercises Active   Pt was found on recliner chair and agreeable to ambulate. Stated having lower abdominal pain due to gas while ambulating. At EOS returned to bed with all necessities in reach.  Ferd Hibbs Mobility Specialist

## 2022-08-28 DIAGNOSIS — N401 Enlarged prostate with lower urinary tract symptoms: Secondary | ICD-10-CM | POA: Diagnosis not present

## 2022-08-28 DIAGNOSIS — Z955 Presence of coronary angioplasty implant and graft: Secondary | ICD-10-CM | POA: Diagnosis not present

## 2022-08-28 DIAGNOSIS — I129 Hypertensive chronic kidney disease with stage 1 through stage 4 chronic kidney disease, or unspecified chronic kidney disease: Secondary | ICD-10-CM | POA: Diagnosis not present

## 2022-08-28 DIAGNOSIS — N183 Chronic kidney disease, stage 3 unspecified: Secondary | ICD-10-CM | POA: Diagnosis not present

## 2022-08-28 DIAGNOSIS — I2510 Atherosclerotic heart disease of native coronary artery without angina pectoris: Secondary | ICD-10-CM | POA: Diagnosis not present

## 2022-08-28 DIAGNOSIS — Z87891 Personal history of nicotine dependence: Secondary | ICD-10-CM | POA: Diagnosis not present

## 2022-08-28 DIAGNOSIS — R339 Retention of urine, unspecified: Secondary | ICD-10-CM | POA: Diagnosis not present

## 2022-08-28 DIAGNOSIS — Z85828 Personal history of other malignant neoplasm of skin: Secondary | ICD-10-CM | POA: Diagnosis not present

## 2022-08-28 LAB — BASIC METABOLIC PANEL
Anion gap: 8 (ref 5–15)
BUN: 19 mg/dL (ref 8–23)
CO2: 25 mmol/L (ref 22–32)
Calcium: 8.9 mg/dL (ref 8.9–10.3)
Chloride: 107 mmol/L (ref 98–111)
Creatinine, Ser: 1.14 mg/dL (ref 0.61–1.24)
GFR, Estimated: >60 mL/min (ref >60)
Glucose, Bld: 112 mg/dL — ABNORMAL HIGH (ref 70–99)
Potassium: 4.2 mmol/L (ref 3.5–5.1)
Sodium: 140 mmol/L (ref 135–145)

## 2022-08-28 LAB — HEMOGLOBIN AND HEMATOCRIT, BLOOD
HCT: 35.6 % — ABNORMAL LOW (ref 39.0–52.0)
Hemoglobin: 11.6 g/dL — ABNORMAL LOW (ref 13.0–17.0)

## 2022-08-28 MED ORDER — CHLORHEXIDINE GLUCONATE CLOTH 2 % EX PADS: 6.0000 | MEDICATED_PAD | Freq: Every day | CUTANEOUS | Status: DC

## 2022-08-28 MED ORDER — FINASTERIDE 5 MG PO TABS: 5.0000 mg | ORAL_TABLET | Freq: Every day | ORAL | 0 refills | Status: AC

## 2022-08-28 NOTE — Plan of Care (Signed)
  Problem: Urinary Elimination: Goal: Home care management will improve Outcome: Progressing   Problem: Education: Goal: Knowledge of the procedure and recovery process will improve Outcome: Adequate for Discharge   Problem: Urinary Elimination: Goal: Ability to avoid or minimize complications of infection will improve Outcome: Adequate for Discharge   Problem: Pain Managment: Goal: General experience of comfort will improve Outcome: Adequate for Discharge

## 2022-08-28 NOTE — Discharge Summary (Addendum)
[facility] Discharge Summary  Admit date: 08/26/2022  Discharge date and time: 08/28/22   Discharge to: Home  Discharge Service: Urology  Discharge Attending Physician:  Dr. Rexene Alberts, MD  Discharge  Diagnoses: Urinary retention  Secondary Diagnosis: Principal Problem:   Urinary retention   OR Procedures: Procedure(s): XI ROBOTIC ASSISTED SIMPLE PROSTATECTOMY 08/26/2022   Ancillary Procedures: None   Discharge Day Services: The patient was seen and examined by the Urology team both in the morning and immediately prior to discharge.  Vital signs and laboratory values were stable and within normal limits.  The physical exam was benign and unchanged and all surgical wounds were examined.  Discharge instructions were explained and all questions answered.  Subjective  No acute events overnight. Pain Controlled. No fever or chills.  Objective Patient Vitals for the past 8 hrs:  BP Temp Pulse Resp SpO2  08/28/22 0455 (!) 152/80 98.3 F (36.8 C) 74 18 95 %   No intake/output data recorded.  General Appearance:        No acute distress Lungs:                       Normal work of breathing on room air Heart:                                Regular rate and rhythm Abdomen:                         Soft, non-tender, non-distended, incisions c/d/I GU:       Foley catheter in place draining clear yellow urine Extremities:                      Warm and well perfused   Hospital Course:  The patient is a 81 year old male with a history of BPH with refractory urinary retention.  The patient underwent robotic simple prostatectomy on 08/26/2022.  The patient tolerated the procedure well, was extubated in the OR, and afterwards was taken to the PACU for routine post-surgical care. When stable the patient was transferred to the floor.   The patient did well postoperatively.  The patient's diet was slowly advanced and at the time of discharge was tolerating a regular diet.  The patient  was discharged home 2 Days Post-Op, at which point was tolerating a regular solid diet, was able to make adequate urine, have adequate pain control with P.O. pain medication, and could ambulate without difficulty.  JP drain was removed prior to discharge.  The patient will follow up with [place] for post op check and catheter removal.  He will continue to hold his home blood thinner until follow-up.  Condition at Discharge: Improved  Discharge Medications:  Allergies as of 08/28/2022       Reactions   Acetaminophen Diarrhea, Nausea And Vomiting   Niacin Other (See Comments)   Flushing         Medication List     STOP taking these medications    clopidogrel 75 MG tablet Commonly known as: PLAVIX   terazosin 2 MG capsule Commonly known as: HYTRIN       TAKE these medications    aspirin EC 325 MG tablet Take 650 mg by mouth daily as needed for moderate pain.   carvedilol 3.125 MG tablet Commonly known as: COREG TAKE ONE TABLET BY MOUTH TWICE DAILY  cephALEXin 500 MG capsule Commonly known as: KEFLEX Take 1 capsule (500 mg total) by mouth 2 (two) times daily for 3 days. Begin day before next Urology appointment What changed:  when to take this additional instructions   Co Q-10 100 MG Caps Take 100 mg by mouth daily.   ezetimibe 10 MG tablet Commonly known as: ZETIA TAKE ONE TABLET BY MOUTH DAILY   finasteride 5 MG tablet Commonly known as: Proscar Take 1 tablet (5 mg total) by mouth daily.   HYDROcodone-acetaminophen 5-325 MG tablet Commonly known as: NORCO/VICODIN Take 1 tablet by mouth every 6 (six) hours as needed for moderate pain or severe pain (post-operatively).   isosorbide mononitrate 60 MG 24 hr tablet Commonly known as: IMDUR TAKE 1 AND 1/2 TABLETS BY MOUTH ONCE DAILY   nitroGLYCERIN 0.4 MG SL tablet Commonly known as: NITROSTAT Place 1 tablet (0.4 mg total) under the tongue every 5 (five) minutes as needed for chest pain (MAX 3 DOSES).    pantoprazole 40 MG tablet Commonly known as: PROTONIX Take 40 mg by mouth daily.   rosuvastatin 40 MG tablet Commonly known as: CRESTOR TAKE ONE TABLET BY MOUTH DAILY

## 2022-08-30 DIAGNOSIS — Z23 Encounter for immunization: Secondary | ICD-10-CM | POA: Diagnosis not present

## 2022-08-30 DIAGNOSIS — R7303 Prediabetes: Secondary | ICD-10-CM | POA: Diagnosis not present

## 2022-08-30 DIAGNOSIS — Z6828 Body mass index (BMI) 28.0-28.9, adult: Secondary | ICD-10-CM | POA: Diagnosis not present

## 2022-08-30 DIAGNOSIS — K219 Gastro-esophageal reflux disease without esophagitis: Secondary | ICD-10-CM | POA: Diagnosis not present

## 2022-08-30 DIAGNOSIS — I10 Essential (primary) hypertension: Secondary | ICD-10-CM | POA: Diagnosis not present

## 2022-08-30 DIAGNOSIS — E782 Mixed hyperlipidemia: Secondary | ICD-10-CM | POA: Diagnosis not present

## 2022-08-30 DIAGNOSIS — Z0000 Encounter for general adult medical examination without abnormal findings: Secondary | ICD-10-CM | POA: Diagnosis not present

## 2022-08-30 LAB — SURGICAL PATHOLOGY

## 2022-09-16 ENCOUNTER — Encounter: Payer: Self-pay | Admitting: Cardiology

## 2022-09-16 ENCOUNTER — Ambulatory Visit: Payer: Medicare HMO | Attending: Cardiology | Admitting: Cardiology

## 2022-09-16 VITALS — BP 130/70 | HR 66 | Ht 66.0 in | Wt 188.0 lb

## 2022-09-16 DIAGNOSIS — E7800 Pure hypercholesterolemia, unspecified: Secondary | ICD-10-CM

## 2022-09-16 DIAGNOSIS — I2510 Atherosclerotic heart disease of native coronary artery without angina pectoris: Secondary | ICD-10-CM

## 2022-09-16 DIAGNOSIS — Z79899 Other long term (current) drug therapy: Secondary | ICD-10-CM

## 2022-09-16 MED ORDER — EZETIMIBE 10 MG PO TABS: 10.0000 mg | ORAL_TABLET | Freq: Every day | ORAL | 3 refills | Status: AC

## 2022-09-16 NOTE — Patient Instructions (Signed)
Medication Instructions:  Please start Zetia 10 mg daily. Continue all other medications as listed.  *If you need a refill on your cardiac medications before your next appointment, please call your pharmacy*  Lab Work: Please have blood work (Lipid panel) in 6 months before seeing Dr Marlou Porch.  If you have labs (blood work) drawn today and your tests are completely normal, you will receive your results only by: [place] (if you have MyChart) OR A paper copy in the mail If you have any lab test that is abnormal or we need to change your treatment, we will call you to review the results.  Follow-Up: At [facility], you and your health needs are our priority.  As part of our continuing mission to provide you with exceptional heart care, we have created designated Provider Care Teams.  These Care Teams include your primary Cardiologist (physician) and Advanced Practice Providers (APPs -  Physician Assistants and Nurse Practitioners) who all work together to provide you with the care you need, when you need it.  We recommend signing up for the patient portal called "MyChart".  Sign up information is provided on this After Visit Summary.  MyChart is used to connect with patients for Virtual Visits (Telemedicine).  Patients are able to view lab/test results, encounter notes, upcoming appointments, etc.  Non-urgent messages can be sent to your provider as well.   To learn more about what you can do with MyChart, go to [email or web address].    Your next appointment:   6 month(s)  The format for your next appointment:   In Person  Provider:   Candee Furbish, MD       Important Information About Sugar

## 2022-09-16 NOTE — Progress Notes (Signed)
Cardiology Office Note:    Date:  09/16/2022   ID:  Thomas Farrell, DOB 1940-12-02, MRN [id number]  PCP:  Thomas Cruel, MD  [facility] Cardiologist:  Thomas Furbish, MD  [facility] Electrophysiologist:  None   Referring MD: Thomas Cruel, MD    History of Present Illness:    Thomas Farrell is a 81 y.o. male here for the follow-up of CAD s/p PTCA/DES to [facility] with wire induced dissection in distal RCA 2009, DES/PTCA to RCA 2013, hypertension, coronary artery disease, and hyperlipidemia.   He was admitted to the hospital 08/26/2022 and underwent robotic simple prostatectomy. He tolerated the procedure well. He was discharged two days post-op, at which point he was tolerating a regular solid diet, was able to make adequate urine.   In his previous visit, he felt too weak to play golf, felt like he was going to fall over.  Blood pressures were soft, in the 60s.  Ate some additional salt and felt better.  Had an inner ear infection at that time.  Previously had drug-eluting stent to the RCA 2009, 2013  At his last appointment, overall he was feeling great. He recently got back to walking after getting shots in his back on 08/24/2021. He has had chronic back pain and bilateral hip pain. Reportedly, he was able to walk about 0.3 miles before he needed to rest due to his hip pain. He was able to get back to exercise. He could walk upstairs without getting winded.  He would get headaches in the morning that he believed were from sinus congestion.  He stopped the medication Flomax because it caused severe fatigue.  He has had 2 Covid immunizations and 2 boosters.   Today, he says he is "getting better by the minute." He states that since his prostatectomy he has felt very improved, with decreased back pain.  He underwent prostatectomy by Dr. Tresa Farrell.  Had very large prostate.  Has stopped several of his blood pressure medications.  Excellent.  Recent labs showed Creatinine  1.08, ALT 37, and LDL 78.  We are restarting Zetia 10 mg.  He has been unable to play golf since about early July 2023 and is looking forward to slowly getting back to playing soon.   He denies any palpitations, chest pain, shortness of breath, or peripheral edema. No lightheadedness, headaches, syncope, orthopnea, or PND.  Past Medical History:  Diagnosis Date   CAD in native artery    a. 2009 - s/p PTCA/DES to RCA with wire-induced dissection in distal RCA. b. 2013 - s/p DES to dRCA and cutting balloon angioplasty to ISR to mRCA   Cancer of skin of temple    "right"   Cardiomyopathy ([place])    a. EF 45-50% in 2009. b. 50-55% by echo 04/2016.   CKD (chronic kidney disease), stage III (HCC)    GERD (gastroesophageal reflux disease)    Hyperlipidemia    Hypertension    Myocardial infarction [facility]) 2009   Obesity    Osteoarthritis     Past Surgical History:  Procedure Laterality Date   CARDIAC CATHETERIZATION  2009;  12/2011   CORONARY ANGIOPLASTY WITH STENT PLACEMENT  2009; 01/2012   "~ 2wk after each cath"   INGUINAL HERNIA REPAIR Right ~ 2010   [place] (PCI-S) N/A 01/25/2012   Procedure: PERCUTANEOUS CORONARY STENT INTERVENTION (PCI-S);  Surgeon: Thomas Booze, MD;  Location: [facility] CATH LAB;  Service: Cardiovascular;  Laterality: N/A;  SKIN CANCER EXCISION Right    temple   TONSILLECTOMY     XI ROBOTIC ASSISTED SIMPLE PROSTATECTOMY N/A 08/26/2022   Procedure: XI ROBOTIC ASSISTED SIMPLE PROSTATECTOMY;  Surgeon: Thomas Frock, MD;  Location: [facility] ORS;  Service: Urology;  Laterality: N/A;  3 HRS    Current Medications: Current Meds  Medication Sig   aspirin EC 325 MG tablet Take 650 mg by mouth daily as needed for moderate pain.   carvedilol (COREG) 3.125 MG tablet TAKE ONE TABLET BY MOUTH TWICE DAILY   ezetimibe (ZETIA) 10 MG tablet Take 1 tablet (10 mg total) by mouth daily.   finasteride (PROSCAR) 5 MG tablet Take 1 tablet (5 mg total) by mouth  daily.   isosorbide mononitrate (IMDUR) 60 MG 24 hr tablet TAKE 1 AND 1/2 TABLETS BY MOUTH ONCE DAILY   nitroGLYCERIN (NITROSTAT) 0.4 MG SL tablet Place 1 tablet (0.4 mg total) under the tongue every 5 (five) minutes as needed for chest pain (MAX 3 DOSES).   pantoprazole (PROTONIX) 40 MG tablet Take 40 mg by mouth daily.   rosuvastatin (CRESTOR) 40 MG tablet TAKE ONE TABLET BY MOUTH DAILY     Allergies:   Acetaminophen and Niacin   Social History   Socioeconomic History   Marital status: Married    Spouse name: Not on file   Number of children: Not on file   Years of education: Not on file   Highest education level: Not on file  Occupational History   Not on file  Tobacco Use   Smoking status: Former    Packs/day: 4.00    Years: 43.00    Total pack years: 172.00    Types: Cigarettes    Quit date: 01/24/2002    Years since quitting: 20.6   Smokeless tobacco: Never  Vaping Use   Vaping Use: Never used  Substance and Sexual Activity   Alcohol use: Yes    Alcohol/week: 2.0 standard drinks of alcohol    Types: 1 Glasses of wine, 1 Shots of liquor per week    Comment: daily   Drug use: No   Sexual activity: Not Currently  Other Topics Concern   Not on file  Social History Narrative   Not on file   Social Determinants of Health   Financial Resource Strain: Not on file  Food Insecurity: No Food Insecurity (08/26/2022)   Hunger Vital Sign    Worried About Running Out of Food in the Last Year: Never true    Ran Out of Food in the Last Year: Never true  Transportation Needs: No Transportation Needs (08/26/2022)   PRAPARE - Hydrologist (Medical): No    Lack of Transportation (Non-Medical): No  Physical Activity: Not on file  Stress: Not on file  Social Connections: Not on file     Family History: The patient's family history includes Heart attack in his brother, father, and paternal uncle; Hypertension in an other family member. There is no  history of Stroke.  ROS:   Please see the history of present illness.   All other systems reviewed and are negative.  EKGs/Labs/Other Studies Reviewed:    The following studies were reviewed today:  Long Term Monitor 06/2022: Sinus rhythm average heart rate 71 bpm   Rare episodes of atrial tachycardia longest lasting 16 seconds   Rare PACs, occasional PVCs-1.9%   No evidence of atrial fibrillation     Patch Wear Time:  13 days and 23 hours (2023-07-21T09:16:13-0400 to  2023-08-04T09:00:24-0400)   Patient had a min HR of 51 bpm, max HR of 167 bpm, and avg HR of 71 bpm. Predominant underlying rhythm was Sinus Rhythm. 1 run of Ventricular Tachycardia occurred lasting 11 beats with a max rate of 167 bpm (avg 148 bpm). 13 Supraventricular Tachycardia  runs occurred, the run with the fastest interval lasting 7 beats with a max rate of 154 bpm, the longest lasting 16.7 secs with an avg rate of 114 bpm. Isolated SVEs were rare (<1.0%), SVE Couplets were rare (<1.0%), and SVE Triplets were rare (<1.0%).  Isolated VEs were occasional (1.9%, [id number]), VE Couplets were rare (<1.0%, 113), and VE Triplets were rare (<1.0%, 8). Ventricular Bigeminy and Trigeminy were present.    ABI 02/20/2020 Summary:  Right: Resting right ankle-brachial index is within normal range. No  evidence of significant right lower extremity arterial disease. The right  toe-brachial index is normal.   Left: Resting left ankle-brachial index is within normal range. No  evidence of significant left lower extremity arterial disease. The left  toe-brachial index is normal.   Echocardiogram 05/11/2016: - Left ventricle: The cavity size was normal. There was mild focal    basal hypertrophy of the septum. Systolic function was normal.    The estimated ejection fraction was in the range of 50% to 55%.    Mild hypokinesis of the inferolateral and inferior myocardium.  - Aortic valve: Trileaflet; mildly thickened, mildly calcified     leaflets. Transvalvular velocity was within the normal range.    There was no stenosis. There was no regurgitation.  - Mitral valve: Transvalvular velocity was within the normal range.    There was no evidence for stenosis. There was trivial    regurgitation.  - Left atrium: The atrium was moderately dilated.  - Right ventricle: The cavity size was normal. Wall thickness was    normal. Systolic function was normal.  - Atrial septum: No defect or patent foramen ovale was identified    by color flow Doppler.  - Tricuspid valve: There was trivial regurgitation.  - Pulmonary arteries: Systolic pressure was within the normal    range. PA peak pressure: 25 mm Hg (S).   NM Myocar Multi 04/21/2016 There was no ST segment deviation noted during stress. There is a medium defect of moderate severity present in the basal inferior, mid inferior and apical inferior location. 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 No ischemia. There is a medium defect of severe severity present in the basal inferolateral, mid inferolateral and apical lateral location. 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 No ischemia. The left ventricular ejection fraction is moderately decreased (30-44%). Nuclear stress EF: 39%. This is a low risk study.  EKG: EKG is personally reviewed and interpreted. 09/16/22: EKG was not ordered. 09/02/2021: Sinus Rhythm Rate 62 bpm with PVCs and PACs 04/14/2020-sinus rhythm.  Recent Labs: 05/31/2022: Magnesium 2.3 06/01/2022: TSH 0.969 06/02/2022: ALT 170 08/15/2022: Platelets 294 08/28/2022: BUN 19; Creatinine, Ser 1.14; Hemoglobin 11.6; Potassium 4.2; Sodium 140  Recent Lipid Panel    Component Value Date/Time   CHOL 131 11/22/2013 1332   TRIG 94.0 11/22/2013 1332   HDL 50.40 11/22/2013 1332   CHOLHDL 3 11/22/2013 1332   VLDL 18.8 11/22/2013 1332   LDLCALC 62 11/22/2013 1332     Risk Assessment/Calculations:       Physical Exam:     VS:  BP 130/70 (BP Location: Left Arm, Patient Position: Sitting, Cuff Size: Normal)  Pulse 66   Ht '5\' 6"'$  (1.676 m)   Wt 188 lb (85.3 kg)   SpO2 96%   BMI 30.34 kg/m     Wt Readings from Last 3 Encounters:  09/16/22 188 lb (85.3 kg)  08/26/22 183 lb (83 kg)  08/15/22 184 lb (83.5 kg)     GEN:  Well nourished, well developed in no acute distress HEENT: Normal NECK: No JVD; No carotid bruits LYMPHATICS: No lymphadenopathy CARDIAC: RRR, no murmurs, rubs, gallops RESPIRATORY:  Clear to auscultation without rales, wheezing or rhonchi  ABDOMEN: Soft, non-tender, non-distended MUSCULOSKELETAL:  No edema; No deformity  SKIN: Warm and dry NEUROLOGIC:  Alert and oriented x 3 PSYCHIATRIC:  Normal affect   ASSESSMENT:    1. Atherosclerosis of native coronary artery of native heart without angina pectoris   2. Primary hypertension   3. Pure hypercholesterolemia     PLAN:    In order of problems listed above:  Coronary atherosclerosis of native coronary artery Prior stents reviewed from cardiac catheterization as above.  Continue with medical management.  Has nitroglycerin if needed for anginal symptoms. Metoprolol for angina.  -On aspirin monotherapy.  He had stopped his Plavix prior to prostate procedures.  I am fine with him being on aspirin only.  It has been several years since his last PCI, 2013.     Hypertension Has had bouts of hypotension. Much improved.  He is off of the amlodipine, lisinopril.    Hyperlipidemia On Crestor as well as Zetia.  LDL 57 in the past.  Excellent.  I would like for him to get back on the Zetia 10 mg.  Prior cholesterol levels showed LDL of 78.  Hemoglobin 14.1 creatinine 0.9 ALT 39 within normal range.  Outside labs.    Wants to live to 89  Follow-up: 6 months.   Medication Adjustments/Labs and Tests Ordered: Current medicines are reviewed at length with the patient today.  Concerns regarding medicines are outlined above.  Orders Placed  This Encounter  Procedures   Lipid panel    Meds ordered this encounter  Medications   ezetimibe (ZETIA) 10 MG tablet    Sig: Take 1 tablet (10 mg total) by mouth daily.    Dispense:  90 tablet    Refill:  3   Patient Instructions  Medication Instructions:  Please start Zetia 10 mg daily. Continue all other medications as listed.  *If you need a refill on your cardiac medications before your next appointment, please call your pharmacy*  Lab Work: Please have blood work (Lipid panel) in 6 months before seeing Dr Marlou Porch.  If you have labs (blood work) drawn today and your tests are completely normal, you will receive your results only by: [place] (if you have MyChart) OR A paper copy in the mail If you have any lab test that is abnormal or we need to change your treatment, we will call you to review the results.  Follow-Up: At [facility], you and your health needs are our priority.  As part of our continuing mission to provide you with exceptional heart care, we have created designated Provider Care Teams.  These Care Teams include your primary Cardiologist (physician) and Advanced Practice Providers (APPs -  Physician Assistants and Nurse Practitioners) who all work together to provide you with the care you need, when you need it.  We recommend signing up for the patient portal called "MyChart".  Sign up information is provided on this After Visit Summary.  MyChart is used to connect with patients for Virtual Visits (Telemedicine).  Patients are able to view lab/test results, encounter notes, upcoming appointments, etc.  Non-urgent messages can be sent to your provider as well.   To learn more about what you can do with MyChart, go to [email or web address].    Your next appointment:   6 month(s)  The format for your next appointment:   In Person  Provider:   Candee Furbish, MD       Important Information About Sugar           I,Breanna  Adamick,acting as a scribe for Thomas Furbish, MD.,have documented all relevant documentation on the behalf of Thomas Furbish, MD,as directed by  Thomas Furbish, MD while in the presence of Thomas Furbish, MD.   I, Thomas Furbish, MD, have reviewed all documentation for this visit. The documentation on 09/16/22 for the exam, diagnosis, procedures, and orders are all accurate and complete.   Signed, Thomas Furbish, MD  09/16/2022 10:37 AM    [facility]

## 2022-09-23 DIAGNOSIS — Z6829 Body mass index (BMI) 29.0-29.9, adult: Secondary | ICD-10-CM | POA: Diagnosis not present

## 2022-09-23 DIAGNOSIS — H9201 Otalgia, right ear: Secondary | ICD-10-CM | POA: Diagnosis not present

## 2022-09-23 DIAGNOSIS — R030 Elevated blood-pressure reading, without diagnosis of hypertension: Secondary | ICD-10-CM | POA: Diagnosis not present

## 2022-12-13 DIAGNOSIS — C61 Malignant neoplasm of prostate: Secondary | ICD-10-CM | POA: Diagnosis not present

## 2022-12-15 DIAGNOSIS — H35363 Drusen (degenerative) of macula, bilateral: Secondary | ICD-10-CM | POA: Diagnosis not present

## 2022-12-15 DIAGNOSIS — H524 Presbyopia: Secondary | ICD-10-CM | POA: Diagnosis not present

## 2022-12-20 DIAGNOSIS — C61 Malignant neoplasm of prostate: Secondary | ICD-10-CM | POA: Diagnosis not present

## 2022-12-20 DIAGNOSIS — R338 Other retention of urine: Secondary | ICD-10-CM | POA: Diagnosis not present

## 2023-01-13 DIAGNOSIS — H35363 Drusen (degenerative) of macula, bilateral: Secondary | ICD-10-CM | POA: Diagnosis not present

## 2023-01-19 ENCOUNTER — Other Ambulatory Visit: Payer: Self-pay | Admitting: Cardiology

## 2023-01-20 DIAGNOSIS — I499 Cardiac arrhythmia, unspecified: Secondary | ICD-10-CM | POA: Diagnosis not present

## 2023-01-20 DIAGNOSIS — I469 Cardiac arrest, cause unspecified: Secondary | ICD-10-CM | POA: Diagnosis not present

## 2023-01-20 DIAGNOSIS — R0689 Other abnormalities of breathing: Secondary | ICD-10-CM | POA: Diagnosis not present

## 2023-01-20 DIAGNOSIS — R404 Transient alteration of awareness: Secondary | ICD-10-CM | POA: Diagnosis not present

## 2023-01-20 DIAGNOSIS — R0902 Hypoxemia: Secondary | ICD-10-CM | POA: Diagnosis not present

## 2023-02-20 DEATH — deceased

## 2023-03-01 ENCOUNTER — Ambulatory Visit: Payer: Medicare HMO | Attending: Cardiology

## 2024-07-22 DEATH — deceased
